# Patient Record
Sex: Female | Born: 1950 | Race: White | Hispanic: No | Smoking: Never smoker
Health system: Southern US, Community
[De-identification: ages and names within clinical notes are randomized; demographics above are authoritative.]

## PROBLEM LIST (undated history)

## (undated) DIAGNOSIS — F329 Major depressive disorder, single episode, unspecified: Secondary | ICD-10-CM

## (undated) DIAGNOSIS — F32A Depression, unspecified: Secondary | ICD-10-CM

## (undated) DIAGNOSIS — M1990 Unspecified osteoarthritis, unspecified site: Secondary | ICD-10-CM

## (undated) HISTORY — PX: ABDOMINAL HYSTERECTOMY: SHX81

---

## 2013-06-04 HISTORY — PX: BONE RESECTION, FINGER: SHX898

## 2014-08-27 DIAGNOSIS — Z1231 Encounter for screening mammogram for malignant neoplasm of breast: Secondary | ICD-10-CM | POA: Diagnosis not present

## 2015-02-16 DIAGNOSIS — F329 Major depressive disorder, single episode, unspecified: Secondary | ICD-10-CM | POA: Diagnosis not present

## 2015-02-16 DIAGNOSIS — N959 Unspecified menopausal and perimenopausal disorder: Secondary | ICD-10-CM | POA: Diagnosis not present

## 2015-03-01 DIAGNOSIS — Z1283 Encounter for screening for malignant neoplasm of skin: Secondary | ICD-10-CM | POA: Diagnosis not present

## 2015-03-01 DIAGNOSIS — L818 Other specified disorders of pigmentation: Secondary | ICD-10-CM | POA: Diagnosis not present

## 2015-10-12 DIAGNOSIS — Z1231 Encounter for screening mammogram for malignant neoplasm of breast: Secondary | ICD-10-CM | POA: Diagnosis not present

## 2015-10-25 DIAGNOSIS — J029 Acute pharyngitis, unspecified: Secondary | ICD-10-CM | POA: Diagnosis not present

## 2016-03-22 DIAGNOSIS — I83813 Varicose veins of bilateral lower extremities with pain: Secondary | ICD-10-CM | POA: Diagnosis not present

## 2016-06-13 DIAGNOSIS — L570 Actinic keratosis: Secondary | ICD-10-CM | POA: Diagnosis not present

## 2016-06-13 DIAGNOSIS — D225 Melanocytic nevi of trunk: Secondary | ICD-10-CM | POA: Diagnosis not present

## 2016-06-13 DIAGNOSIS — Z1283 Encounter for screening for malignant neoplasm of skin: Secondary | ICD-10-CM | POA: Diagnosis not present

## 2016-06-13 DIAGNOSIS — X32XXXD Exposure to sunlight, subsequent encounter: Secondary | ICD-10-CM | POA: Diagnosis not present

## 2016-06-13 DIAGNOSIS — L578 Other skin changes due to chronic exposure to nonionizing radiation: Secondary | ICD-10-CM | POA: Diagnosis not present

## 2016-06-15 DIAGNOSIS — Z23 Encounter for immunization: Secondary | ICD-10-CM | POA: Diagnosis not present

## 2016-06-15 DIAGNOSIS — Z0000 Encounter for general adult medical examination without abnormal findings: Secondary | ICD-10-CM | POA: Diagnosis not present

## 2016-06-15 DIAGNOSIS — E2839 Other primary ovarian failure: Secondary | ICD-10-CM | POA: Diagnosis not present

## 2016-06-15 DIAGNOSIS — F3342 Major depressive disorder, recurrent, in full remission: Secondary | ICD-10-CM | POA: Diagnosis not present

## 2016-06-15 DIAGNOSIS — Z1159 Encounter for screening for other viral diseases: Secondary | ICD-10-CM | POA: Diagnosis not present

## 2016-06-15 DIAGNOSIS — Z13220 Encounter for screening for lipoid disorders: Secondary | ICD-10-CM | POA: Diagnosis not present

## 2016-06-15 DIAGNOSIS — Z131 Encounter for screening for diabetes mellitus: Secondary | ICD-10-CM | POA: Diagnosis not present

## 2016-07-15 DIAGNOSIS — R350 Frequency of micturition: Secondary | ICD-10-CM | POA: Diagnosis not present

## 2016-07-15 DIAGNOSIS — R300 Dysuria: Secondary | ICD-10-CM | POA: Diagnosis not present

## 2016-09-04 DIAGNOSIS — M79644 Pain in right finger(s): Secondary | ICD-10-CM | POA: Diagnosis not present

## 2016-09-04 DIAGNOSIS — M19041 Primary osteoarthritis, right hand: Secondary | ICD-10-CM | POA: Diagnosis not present

## 2016-09-04 DIAGNOSIS — M19042 Primary osteoarthritis, left hand: Secondary | ICD-10-CM | POA: Diagnosis not present

## 2016-09-25 ENCOUNTER — Other Ambulatory Visit: Payer: Self-pay | Admitting: Family Medicine

## 2016-09-25 DIAGNOSIS — E2839 Other primary ovarian failure: Secondary | ICD-10-CM

## 2016-10-18 DIAGNOSIS — M85851 Other specified disorders of bone density and structure, right thigh: Secondary | ICD-10-CM | POA: Diagnosis not present

## 2016-10-18 DIAGNOSIS — Z803 Family history of malignant neoplasm of breast: Secondary | ICD-10-CM | POA: Diagnosis not present

## 2016-10-18 DIAGNOSIS — Z780 Asymptomatic menopausal state: Secondary | ICD-10-CM | POA: Diagnosis not present

## 2016-10-18 DIAGNOSIS — Z1231 Encounter for screening mammogram for malignant neoplasm of breast: Secondary | ICD-10-CM | POA: Diagnosis not present

## 2016-11-22 DIAGNOSIS — M85851 Other specified disorders of bone density and structure, right thigh: Secondary | ICD-10-CM | POA: Diagnosis not present

## 2016-11-22 DIAGNOSIS — K30 Functional dyspepsia: Secondary | ICD-10-CM | POA: Diagnosis not present

## 2016-11-23 ENCOUNTER — Other Ambulatory Visit: Payer: Self-pay | Admitting: Family Medicine

## 2016-11-23 DIAGNOSIS — R1013 Epigastric pain: Secondary | ICD-10-CM

## 2016-11-26 ENCOUNTER — Ambulatory Visit
Admission: RE | Admit: 2016-11-26 | Discharge: 2016-11-26 | Disposition: A | Discharge status: Home / Self Care | Payer: Medicare Other | Source: Ambulatory Visit | Attending: Family Medicine | Admitting: Family Medicine

## 2016-11-26 DIAGNOSIS — R1011 Right upper quadrant pain: Secondary | ICD-10-CM | POA: Diagnosis not present

## 2016-11-26 DIAGNOSIS — R945 Abnormal results of liver function studies: Secondary | ICD-10-CM | POA: Diagnosis not present

## 2016-11-26 DIAGNOSIS — K8020 Calculus of gallbladder without cholecystitis without obstruction: Secondary | ICD-10-CM | POA: Diagnosis not present

## 2016-11-26 DIAGNOSIS — R1013 Epigastric pain: Secondary | ICD-10-CM

## 2016-12-06 ENCOUNTER — Other Ambulatory Visit: Payer: Self-pay

## 2016-12-19 ENCOUNTER — Ambulatory Visit: Payer: Self-pay | Admitting: Surgery

## 2016-12-19 DIAGNOSIS — K8050 Calculus of bile duct without cholangitis or cholecystitis without obstruction: Secondary | ICD-10-CM | POA: Diagnosis not present

## 2016-12-19 NOTE — H&P (Signed)
Taylor Le 12/19/2016 1:57 PM Location: [facility] Patient #: [id number] DOB: Dec 12, 1950 Married / Language: English / Race: White Female  History of Present Illness (Wade Asebedo A. Kae Heller MD; 12/19/2016 2:20 PM) Patient words: This is a very pleasant and otherwise healthy 66 year old woman who is here with her husband today to discuss gallbladder surgery.  About 3 months ago she started having intermittent attacks of epigastric and right upper quadrant pain. This typically would begin about 3 hours after eating. They would last for about 3-4 hours, and were associated with nausea and vomiting. She was not able to identify triggering foods, however she did notice that she has not had any attacks since stopping consumption of cheese and dairy products. She does not have any lower GI symptoms, no melena or hematochezia. She did try over-the-counter PPIs without any relief of the pain.  She underwent a workup including labs and right upper quadrant ultrasound. Her lab work included a CBC, CMP, lipase and vitamin D on 6/21. Her liver enzymes were elevated with AST of 272, ALT of 337, and alkaline phosphatase of 349. Total bilirubin normal at 0.6. Lipase 32. The remainder of the aforementioned labs were normal. The ultrasound revealed multiple gallstones the largest measures 0.5 cm but no signs of cholecystitis. Common bile duct measured 0.5 cm. There was no focal lesion or parenchymal abnormality noted of the liver.  The patient is a 66 year old female.   Past Surgical History Malachy Moan, [place]; 12/19/2016 1:57 PM) Hysterectomy (not due to cancer) - Partial  Diagnostic Studies History Malachy Moan, RMA; 12/19/2016 1:57 PM) Colonoscopy 5-10 years ago Mammogram within last year Pap Smear 1-5 years ago  Allergies Malachy Moan, RMA; 12/19/2016 1:58 PM) Penicillins Hives.  Medication History Malachy Moan, [place]; 12/19/2016 1:59 PM) Mamie Nick  (Transdermal) Specific strength unknown - Active. BuPROPion HCl (Oral) Specific strength unknown - Active. Calcium (600MG  Tablet, 1200 Oral) Active. Medications Reconciled  Social History Malachy Moan, [place]; 12/19/2016 1:57 PM) Alcohol use Occasional alcohol use. Caffeine use Carbonated beverages, Tea. No drug use Tobacco use Never smoker.  Family History Malachy Moan, [place]; 12/19/2016 1:57 PM) Alcohol Abuse Father. Arthritis Mother. Cancer Mother. Colon Polyps Father. Heart Disease Father. Hypertension Father.  Pregnancy / Birth History Malachy Moan, [place]; 12/19/2016 1:57 PM) Age at menarche 57 years. Age of menopause 17-50 Contraceptive History Oral contraceptives. Gravida 0 Para 0 Regular periods  Other Problems Malachy Moan, RMA; 12/19/2016 1:57 PM) Arthritis Cholelithiasis General anesthesia - complications     Review of Systems Malachy Moan RMA; 12/19/2016 1:57 PM) General Not Present- Appetite Loss, Chills, Fatigue, Fever, Night Sweats, Weight Gain and Weight Loss. Skin Not Present- Change in Wart/Mole, Dryness, Hives, Jaundice, New Lesions, Non-Healing Wounds, Rash and Ulcer. HEENT Present- Wears glasses/contact lenses. Not Present- Earache, Hearing Loss, Hoarseness, Nose Bleed, Oral Ulcers, Ringing in the Ears, Seasonal Allergies, Sinus Pain, Sore Throat, Visual Disturbances and Yellow Eyes. Respiratory Not Present- Bloody sputum, Chronic Cough, Difficulty Breathing, Snoring and Wheezing. Breast Not Present- Breast Mass, Breast Pain, Nipple Discharge and Skin Changes. Cardiovascular Not Present- Chest Pain, Difficulty Breathing Lying Down, Leg Cramps, Palpitations, Rapid Heart Rate, Shortness of Breath and Swelling of Extremities. Gastrointestinal Present- Abdominal Pain, Indigestion and Nausea. Not Present- Bloating, Bloody Stool, Change in Bowel Habits, Chronic diarrhea, Constipation, Difficulty Swallowing, Excessive gas,  Gets full quickly at meals, Hemorrhoids, Rectal Pain and Vomiting. Female Genitourinary Not Present- Frequency, Nocturia, Painful Urination, Pelvic Pain and Urgency. Musculoskeletal Present- Joint Pain, Joint Stiffness and Swelling  of Extremities. Not Present- Back Pain, Muscle Pain and Muscle Weakness. Neurological Not Present- Decreased Memory, Fainting, Headaches, Numbness, Seizures, Tingling, Tremor, Trouble walking and Weakness. Psychiatric Not Present- Anxiety, Bipolar, Change in Sleep Pattern, Depression, Fearful and Frequent crying. Endocrine Not Present- Cold Intolerance, Excessive Hunger, Hair Changes, Heat Intolerance, Hot flashes and New Diabetes. Hematology Present- Easy Bruising. Not Present- Blood Thinners, Excessive bleeding, Gland problems, HIV and Persistent Infections.  Vitals Malachy Moan RMA; 12/19/2016 1:59 PM) 12/19/2016 1:59 PM Weight: 109.6 lb Height: 63in Body Surface Area: 1.5 m Body Mass Index: 19.41 kg/m  Temp.: 97.57F  Pulse: 67 (Regular)  BP: 120/66 (Sitting, Left Arm, Standard)      Physical Exam (Jovon Winterhalter A. Kae Heller MD; 12/19/2016 2:22 PM)  General Note: She is alert and well-appearing  Integumentary Note: Skin is warm and dry  Head and Neck Note: No mass or thyromegaly  Eye Note: Pupils equally round and reactive. No scleral icterus.  Chest and Lung Exam Note: Unlabored respirations. No tactile fremitus  Cardiovascular Note: Regular rate and rhythm. No pedal edema  Abdomen Note: Soft, nontender nondistended. No hernia or mass or organomegaly.  Neurologic Note: Grossly intact, normal gait  Neuropsychiatric Note: Normal mood and affect, appropriate insight  Musculoskeletal Note: Extremities symmetrical throughout, no deformity    Assessment & Plan (British Moyd A. Kae Heller MD; 12/19/2016 8:93 PM)  BILIARY COLIC (T34.28) Story: She has typical symptoms and stones on the ultrasound. I suspect that the enzyme  elevation is secondary to inflammation of the gallbladder bed. We discussed the natural history of gallstones including the possibility of ongoing symptoms, cholecystitis, pancreatitis, cholangitis as well as signs and symptoms of those which should prompt her to go to the ER. I do recommend that she proceed with laparoscopic cholecystectomy given her symptoms. We discussed the surgery. Discussed risks of bleeding, infection, pain, scarring, intra-abdominal injury specifically to the common bile duct and sequelae. Discussed the low risk of conversion to open surgery, as well as the anticipated postoperative recovery. She has been had several insightful questions all of which were answered to their satisfaction. We'll get her scheduled as soon as possible.

## 2016-12-20 NOTE — Pre-Procedure Instructions (Signed)
Taylor Le  12/20/2016      [id number] - [place], [place] [place] [address] Phone: [phone] Fax: [phone]    Your procedure is scheduled on Wednesday, July 25  Report to [facility] Admitting at 5:30 AM                 Your surgery or procedure is scheduled for 7:30 A.M.   Call this number if you have problems the morning of surgery: [phone] (pre- op desk)               For any other questions, please call [phone], Monday - Friday 8 AM - 4 PM.- ask to speck to a nurse.     Remember:  Do not eat food or drink liquids after midnight  Tuesday, July 24.  Take these medicines the morning of surgery with A SIP OF WATER : buPROPion (WELLBUTRIN XL).  Special instructions:                                                      Taylor Le- Preparing For Surgery  Before surgery, you can play an important role. Because skin is not sterile, your skin needs to be as free of germs as possible. You can reduce the number of germs on your skin by washing with CHG (chlorahexidine gluconate) Soap before surgery.  CHG is an antiseptic cleaner which kills germs and bonds with the skin to continue killing germs even after washing.  Please do not use if you have an allergy to CHG or antibacterial soaps. If your skin becomes reddened/irritated stop using the CHG.  Do not shave (including legs and underarms) for at least 48 hours prior to first CHG shower. It is OK to shave your face.  Please follow these instructions carefully.   1. Shower the NIGHT BEFORE SURGERY and the MORNING OF SURGERY with CHG.   2. If you chose to wash your hair, wash your hair first as usual with your normal shampoo.  3. After you shampoo, rinse your hair and body thoroughly to remove the shampoo.   Wash your face and private area with your normal soap and rinse.  4. Use CHG as you would any other liquid soap. You can  apply CHG directly to the skin and wash gently with a scrungie or a clean washcloth.   5. Apply the CHG Soap to your body ONLY FROM THE NECK DOWN.  Do not use on open wounds or open sores. Avoid contact with your eyes, ears, mouth and genitals (private parts). Wash genitals (private parts) with your normal soap.  6. Wash thoroughly, paying special attention to the area where your surgery will be performed.  7. Thoroughly rinse your body with warm water from the neck down.  8. DO NOT shower/wash with your normal soap after using and rinsing off the CHG Soap.  9. Pat yourself dry with a CLEAN TOWEL.   10. Wear CLEAN PAJAMAS   11. Place CLEAN SHEETS on your bed the night of your first shower and DO NOT SLEEP WITH PETS.  Day of Surgery:  Shower the Morning of Surgery Do not apply any deodorants/lotions, powders, or perfumes. Please wear clean clothes to the hospital/surgery center.    Do not  wear jewelry, make-up or nail polish.  Do not shave 48 hours prior to surgery.  Men may shave face and neck.  Do not bring valuables to the hospital.  [facility] is not responsible for any belongings or valuables.  Contacts, dentures or bridgework may not be worn into surgery.  Leave your suitcase in the car.  After surgery it may be brought to your room.  For patients admitted to the hospital, discharge time will be determined by your treatment team.  Patients discharged the day of surgery will not be allowed to drive home.   Name and phone number of your driver:   -  Please read over the following fact sheets that you were given. Coughing and Deep Breathing and Pain Booklet, Surgical Site Infections

## 2016-12-21 ENCOUNTER — Encounter (HOSPITAL_COMMUNITY): Payer: Self-pay

## 2016-12-21 ENCOUNTER — Encounter (HOSPITAL_COMMUNITY)
Admission: RE | Admit: 2016-12-21 | Discharge: 2016-12-21 | Disposition: A | Discharge status: Home / Self Care | Payer: Medicare Other | Source: Ambulatory Visit | Attending: Surgery | Admitting: Surgery

## 2016-12-21 DIAGNOSIS — Z01818 Encounter for other preprocedural examination: Secondary | ICD-10-CM | POA: Insufficient documentation

## 2016-12-21 DIAGNOSIS — Z01812 Encounter for preprocedural laboratory examination: Secondary | ICD-10-CM | POA: Diagnosis not present

## 2016-12-21 DIAGNOSIS — K8050 Calculus of bile duct without cholangitis or cholecystitis without obstruction: Secondary | ICD-10-CM | POA: Insufficient documentation

## 2016-12-21 HISTORY — DX: Depression, unspecified: F32.A

## 2016-12-21 HISTORY — DX: Unspecified osteoarthritis, unspecified site: M19.90

## 2016-12-21 HISTORY — DX: Major depressive disorder, single episode, unspecified: F32.9

## 2016-12-21 LAB — CBC
HCT: 39.4 % (ref 36.0–46.0)
HEMOGLOBIN: 13.2 g/dL (ref 12.0–15.0)
MCH: 29.9 pg (ref 26.0–34.0)
MCHC: 33.5 g/dL (ref 30.0–36.0)
MCV: 89.1 fL (ref 78.0–100.0)
PLATELETS: 221 10*3/uL (ref 150–400)
RBC: 4.42 MIL/uL (ref 3.87–5.11)
RDW: 13.2 % (ref 11.5–15.5)
WBC: 4.6 10*3/uL (ref 4.0–10.5)

## 2016-12-21 LAB — BASIC METABOLIC PANEL
Anion gap: 8 (ref 5–15)
BUN: 14 mg/dL (ref 6–20)
CALCIUM: 10 mg/dL (ref 8.9–10.3)
CO2: 31 mmol/L (ref 22–32)
CREATININE: 0.86 mg/dL (ref 0.44–1.00)
Chloride: 101 mmol/L (ref 101–111)
GFR calc Af Amer: >60 mL/min (ref >60)
GLUCOSE: 92 mg/dL (ref 65–99)
Potassium: 4.2 mmol/L (ref 3.5–5.1)
Sodium: 140 mmol/L (ref 135–145)

## 2016-12-21 MED ORDER — CHLORHEXIDINE GLUCONATE 4 % EX LIQD: 60.0000 mL | Freq: Once | CUTANEOUS | Status: DC

## 2016-12-21 NOTE — Progress Notes (Addendum)
UYW:XIPPND, Lennette Bihari, MD  Cardiologist: pt denies   EKG: pt denies past year if ever  Stress test: pt denies ever   ECHO: pt denies ever  Cardiac Cath: pt denies ever  Chest x-ray: pt denies ever

## 2016-12-26 ENCOUNTER — Ambulatory Visit (HOSPITAL_COMMUNITY)
Admission: RE | Admit: 2016-12-26 | Discharge: 2016-12-26 | Disposition: A | Discharge status: Home / Self Care | Payer: Medicare Other | Source: Ambulatory Visit | Attending: Surgery | Admitting: Surgery

## 2016-12-26 ENCOUNTER — Ambulatory Visit (HOSPITAL_COMMUNITY): Payer: Medicare Other | Admitting: Anesthesiology

## 2016-12-26 ENCOUNTER — Encounter (HOSPITAL_COMMUNITY): Payer: Self-pay | Admitting: *Deleted

## 2016-12-26 ENCOUNTER — Encounter (HOSPITAL_COMMUNITY)
Admission: RE | Disposition: A | Discharge status: Home / Self Care | Payer: Self-pay | Source: Ambulatory Visit | Attending: Surgery

## 2016-12-26 DIAGNOSIS — Z811 Family history of alcohol abuse and dependence: Secondary | ICD-10-CM | POA: Insufficient documentation

## 2016-12-26 DIAGNOSIS — M1990 Unspecified osteoarthritis, unspecified site: Secondary | ICD-10-CM | POA: Insufficient documentation

## 2016-12-26 DIAGNOSIS — K828 Other specified diseases of gallbladder: Secondary | ICD-10-CM | POA: Insufficient documentation

## 2016-12-26 DIAGNOSIS — Z8261 Family history of arthritis: Secondary | ICD-10-CM | POA: Insufficient documentation

## 2016-12-26 DIAGNOSIS — F329 Major depressive disorder, single episode, unspecified: Secondary | ICD-10-CM | POA: Insufficient documentation

## 2016-12-26 DIAGNOSIS — Z8371 Family history of colonic polyps: Secondary | ICD-10-CM | POA: Diagnosis not present

## 2016-12-26 DIAGNOSIS — Z880 Allergy status to penicillin: Secondary | ICD-10-CM | POA: Diagnosis not present

## 2016-12-26 DIAGNOSIS — Z90710 Acquired absence of both cervix and uterus: Secondary | ICD-10-CM | POA: Insufficient documentation

## 2016-12-26 DIAGNOSIS — Z809 Family history of malignant neoplasm, unspecified: Secondary | ICD-10-CM | POA: Insufficient documentation

## 2016-12-26 DIAGNOSIS — K8010 Calculus of gallbladder with chronic cholecystitis without obstruction: Secondary | ICD-10-CM | POA: Diagnosis not present

## 2016-12-26 DIAGNOSIS — Z8249 Family history of ischemic heart disease and other diseases of the circulatory system: Secondary | ICD-10-CM | POA: Insufficient documentation

## 2016-12-26 DIAGNOSIS — K7689 Other specified diseases of liver: Secondary | ICD-10-CM | POA: Diagnosis not present

## 2016-12-26 DIAGNOSIS — K740 Hepatic fibrosis: Secondary | ICD-10-CM | POA: Diagnosis not present

## 2016-12-26 DIAGNOSIS — K8050 Calculus of bile duct without cholangitis or cholecystitis without obstruction: Secondary | ICD-10-CM | POA: Diagnosis not present

## 2016-12-26 DIAGNOSIS — K8064 Calculus of gallbladder and bile duct with chronic cholecystitis without obstruction: Secondary | ICD-10-CM | POA: Diagnosis present

## 2016-12-26 HISTORY — PX: CHOLECYSTECTOMY: SHX55

## 2016-12-26 HISTORY — PX: LIVER BIOPSY: SHX301

## 2016-12-26 SURGERY — LAPAROSCOPIC CHOLECYSTECTOMY
Anesthesia: General

## 2016-12-26 MED ORDER — LACTATED RINGERS IV SOLN
INTRAVENOUS | Status: DC | PRN
  Administered 2016-12-26 (×2): via INTRAVENOUS

## 2016-12-26 MED ORDER — 0.9 % SODIUM CHLORIDE (POUR BTL) OPTIME
TOPICAL | Status: DC | PRN
  Administered 2016-12-26: 1000 mL

## 2016-12-26 MED ORDER — SODIUM CHLORIDE 0.9% FLUSH: 3.0000 mL | INTRAVENOUS | Status: DC | PRN

## 2016-12-26 MED ORDER — ONDANSETRON HCL 4 MG/2ML IJ SOLN: 4.0000 mg | Freq: Four times a day (QID) | INTRAMUSCULAR | Status: DC | PRN

## 2016-12-26 MED ORDER — CEFAZOLIN SODIUM-DEXTROSE 2-4 GM/100ML-% IV SOLN
2.0000 g | INTRAVENOUS | Status: AC
  Administered 2016-12-26: 2 g via INTRAVENOUS

## 2016-12-26 MED ORDER — LIDOCAINE HCL (CARDIAC) 20 MG/ML IV SOLN
INTRAVENOUS | Status: DC | PRN
  Administered 2016-12-26: 50 mg via INTRAVENOUS

## 2016-12-26 MED ORDER — PHENYLEPHRINE 40 MCG/ML (10ML) SYRINGE FOR IV PUSH (FOR BLOOD PRESSURE SUPPORT)
PREFILLED_SYRINGE | INTRAVENOUS | Status: DC | PRN
  Administered 2016-12-26: 80 ug via INTRAVENOUS

## 2016-12-26 MED ORDER — SUGAMMADEX SODIUM 200 MG/2ML IV SOLN
INTRAVENOUS | Status: AC
  Filled 2016-12-26: qty 2

## 2016-12-26 MED ORDER — OXYCODONE HCL 5 MG PO TABS
ORAL_TABLET | ORAL | Status: AC
  Administered 2016-12-26: 10 mg via ORAL
  Filled 2016-12-26: qty 2

## 2016-12-26 MED ORDER — GABAPENTIN 300 MG PO CAPS
300.0000 mg | ORAL_CAPSULE | ORAL | Status: AC
  Administered 2016-12-26: 300 mg via ORAL

## 2016-12-26 MED ORDER — SODIUM CHLORIDE 0.9 % IR SOLN
Status: DC | PRN
  Administered 2016-12-26: 1

## 2016-12-26 MED ORDER — ACETAMINOPHEN 500 MG PO TABS
ORAL_TABLET | ORAL | Status: AC
  Administered 2016-12-26: 1000 mg via ORAL
  Filled 2016-12-26: qty 2

## 2016-12-26 MED ORDER — FENTANYL CITRATE (PF) 100 MCG/2ML IJ SOLN
INTRAMUSCULAR | Status: DC | PRN
  Administered 2016-12-26: 50 ug via INTRAVENOUS
  Administered 2016-12-26: 100 ug via INTRAVENOUS

## 2016-12-26 MED ORDER — HYDROCODONE-ACETAMINOPHEN 5-325 MG PO TABS: 1.0000 | ORAL_TABLET | Freq: Four times a day (QID) | ORAL | 0 refills | Status: AC | PRN

## 2016-12-26 MED ORDER — GABAPENTIN 300 MG PO CAPS
ORAL_CAPSULE | ORAL | Status: AC
  Administered 2016-12-26: 300 mg via ORAL
  Filled 2016-12-26: qty 1

## 2016-12-26 MED ORDER — SCOPOLAMINE 1 MG/3DAYS TD PT72
MEDICATED_PATCH | TRANSDERMAL | Status: AC
  Filled 2016-12-26: qty 1

## 2016-12-26 MED ORDER — CELECOXIB 200 MG PO CAPS
400.0000 mg | ORAL_CAPSULE | ORAL | Status: AC
  Administered 2016-12-26: 200 mg via ORAL

## 2016-12-26 MED ORDER — DEXAMETHASONE SODIUM PHOSPHATE 10 MG/ML IJ SOLN
INTRAMUSCULAR | Status: AC
  Filled 2016-12-26: qty 1

## 2016-12-26 MED ORDER — FENTANYL CITRATE (PF) 250 MCG/5ML IJ SOLN
INTRAMUSCULAR | Status: AC
  Filled 2016-12-26: qty 5

## 2016-12-26 MED ORDER — LIDOCAINE 2% (20 MG/ML) 5 ML SYRINGE
INTRAMUSCULAR | Status: AC
  Filled 2016-12-26: qty 5

## 2016-12-26 MED ORDER — EPHEDRINE SULFATE-NACL 50-0.9 MG/10ML-% IV SOSY
PREFILLED_SYRINGE | INTRAVENOUS | Status: DC | PRN
  Administered 2016-12-26 (×3): 10 mg via INTRAVENOUS

## 2016-12-26 MED ORDER — HYDROMORPHONE HCL 1 MG/ML IJ SOLN
INTRAMUSCULAR | Status: AC
  Administered 2016-12-26: 0.5 mg via INTRAVENOUS
  Filled 2016-12-26: qty 0.5

## 2016-12-26 MED ORDER — ONDANSETRON HCL 4 MG/2ML IJ SOLN
INTRAMUSCULAR | Status: DC | PRN
  Administered 2016-12-26: 4 mg via INTRAVENOUS

## 2016-12-26 MED ORDER — HYDROMORPHONE HCL 1 MG/ML IJ SOLN
INTRAMUSCULAR | Status: AC
  Filled 2016-12-26: qty 0.5

## 2016-12-26 MED ORDER — ROCURONIUM BROMIDE 10 MG/ML (PF) SYRINGE
PREFILLED_SYRINGE | INTRAVENOUS | Status: AC
  Filled 2016-12-26: qty 5

## 2016-12-26 MED ORDER — CEFAZOLIN SODIUM-DEXTROSE 2-4 GM/100ML-% IV SOLN
INTRAVENOUS | Status: AC
  Filled 2016-12-26: qty 100

## 2016-12-26 MED ORDER — ACETAMINOPHEN 325 MG PO TABS
650.0000 mg | ORAL_TABLET | ORAL | Status: DC | PRN
  Administered 2016-12-26: 650 mg via ORAL

## 2016-12-26 MED ORDER — BUPIVACAINE-EPINEPHRINE (PF) 0.25% -1:200000 IJ SOLN
INTRAMUSCULAR | Status: AC
  Filled 2016-12-26: qty 30

## 2016-12-26 MED ORDER — PROPOFOL 10 MG/ML IV BOLUS
INTRAVENOUS | Status: AC
  Filled 2016-12-26: qty 20

## 2016-12-26 MED ORDER — SCOPOLAMINE 1 MG/3DAYS TD PT72
1.0000 | MEDICATED_PATCH | TRANSDERMAL | Status: DC
  Administered 2016-12-26: 1.5 mg via TRANSDERMAL
  Filled 2016-12-26: qty 1

## 2016-12-26 MED ORDER — ACETAMINOPHEN 500 MG PO TABS
1000.0000 mg | ORAL_TABLET | ORAL | Status: AC
  Administered 2016-12-26: 1000 mg via ORAL

## 2016-12-26 MED ORDER — MIDAZOLAM HCL 5 MG/5ML IJ SOLN
INTRAMUSCULAR | Status: DC | PRN
  Administered 2016-12-26: 2 mg via INTRAVENOUS

## 2016-12-26 MED ORDER — DOCUSATE SODIUM 100 MG PO CAPS: 100.0000 mg | ORAL_CAPSULE | Freq: Two times a day (BID) | ORAL | 0 refills | Status: AC

## 2016-12-26 MED ORDER — ACETAMINOPHEN 325 MG PO TABS
ORAL_TABLET | ORAL | Status: AC
  Administered 2016-12-26: 650 mg via ORAL
  Filled 2016-12-26: qty 2

## 2016-12-26 MED ORDER — HYDROMORPHONE HCL 1 MG/ML IJ SOLN
0.2500 mg | INTRAMUSCULAR | Status: DC | PRN
  Administered 2016-12-26 (×2): 0.5 mg via INTRAVENOUS

## 2016-12-26 MED ORDER — OXYCODONE HCL 5 MG PO TABS
5.0000 mg | ORAL_TABLET | ORAL | Status: DC | PRN
  Administered 2016-12-26: 10 mg via ORAL

## 2016-12-26 MED ORDER — ROCURONIUM BROMIDE 100 MG/10ML IV SOLN
INTRAVENOUS | Status: DC | PRN
  Administered 2016-12-26: 50 mg via INTRAVENOUS

## 2016-12-26 MED ORDER — PHENYLEPHRINE 40 MCG/ML (10ML) SYRINGE FOR IV PUSH (FOR BLOOD PRESSURE SUPPORT)
PREFILLED_SYRINGE | INTRAVENOUS | Status: AC
  Filled 2016-12-26: qty 10

## 2016-12-26 MED ORDER — MIDAZOLAM HCL 2 MG/2ML IJ SOLN
INTRAMUSCULAR | Status: AC
  Filled 2016-12-26: qty 2

## 2016-12-26 MED ORDER — CELECOXIB 200 MG PO CAPS
ORAL_CAPSULE | ORAL | Status: AC
  Administered 2016-12-26: 200 mg via ORAL
  Filled 2016-12-26: qty 2

## 2016-12-26 MED ORDER — PROPOFOL 10 MG/ML IV BOLUS
INTRAVENOUS | Status: DC | PRN
  Administered 2016-12-26: 130 mg via INTRAVENOUS

## 2016-12-26 MED ORDER — SODIUM CHLORIDE 0.9% FLUSH: 3.0000 mL | Freq: Two times a day (BID) | INTRAVENOUS | Status: DC

## 2016-12-26 MED ORDER — SODIUM CHLORIDE 0.9 % IV SOLN: 250.0000 mL | INTRAVENOUS | Status: DC | PRN

## 2016-12-26 MED ORDER — SUGAMMADEX SODIUM 200 MG/2ML IV SOLN
INTRAVENOUS | Status: DC | PRN
  Administered 2016-12-26: 200 mg via INTRAVENOUS

## 2016-12-26 MED ORDER — DEXAMETHASONE SODIUM PHOSPHATE 10 MG/ML IJ SOLN
INTRAMUSCULAR | Status: DC | PRN
  Administered 2016-12-26: 10 mg via INTRAVENOUS

## 2016-12-26 MED ORDER — EPHEDRINE 5 MG/ML INJ
INTRAVENOUS | Status: AC
  Filled 2016-12-26: qty 10

## 2016-12-26 MED ORDER — BUPIVACAINE-EPINEPHRINE 0.25% -1:200000 IJ SOLN
INTRAMUSCULAR | Status: DC | PRN
  Administered 2016-12-26: 21 mL

## 2016-12-26 MED ORDER — ONDANSETRON HCL 4 MG/2ML IJ SOLN
INTRAMUSCULAR | Status: AC
  Filled 2016-12-26: qty 2

## 2016-12-26 MED ORDER — FENTANYL CITRATE (PF) 100 MCG/2ML IJ SOLN: 25.0000 ug | INTRAMUSCULAR | Status: DC | PRN

## 2016-12-26 MED ORDER — OXYCODONE HCL 5 MG/5ML PO SOLN: 5.0000 mg | Freq: Once | ORAL | Status: DC | PRN

## 2016-12-26 MED ORDER — ACETAMINOPHEN 650 MG RE SUPP: 650.0000 mg | RECTAL | Status: DC | PRN

## 2016-12-26 MED ORDER — OXYCODONE HCL 5 MG PO TABS: 5.0000 mg | ORAL_TABLET | Freq: Once | ORAL | Status: DC | PRN

## 2016-12-26 SURGICAL SUPPLY — 33 items
APPLIER CLIP 5 13 M/L LIGAMAX5 (MISCELLANEOUS) ×3
BLADE CLIPPER SURG (BLADE) IMPLANT
CANISTER SUCT 3000ML PPV (MISCELLANEOUS) ×3 IMPLANT
CHLORAPREP W/TINT 26ML (MISCELLANEOUS) ×3 IMPLANT
CLIP APPLIE 5 13 M/L LIGAMAX5 (MISCELLANEOUS) ×1 IMPLANT
COVER SURGICAL LIGHT HANDLE (MISCELLANEOUS) ×3 IMPLANT
DERMABOND ADVANCED (GAUZE/BANDAGES/DRESSINGS) ×2
DERMABOND ADVANCED .7 DNX12 (GAUZE/BANDAGES/DRESSINGS) ×1 IMPLANT
ELECT REM PT RETURN 9FT ADLT (ELECTROSURGICAL) ×3
ELECTRODE REM PT RTRN 9FT ADLT (ELECTROSURGICAL) ×1 IMPLANT
GLOVE BIO SURGEON STRL SZ 6 (GLOVE) ×3 IMPLANT
GLOVE BIOGEL PI IND STRL 6.5 (GLOVE) ×1 IMPLANT
GLOVE BIOGEL PI INDICATOR 6.5 (GLOVE) ×2
GOWN STRL REUS W/ TWL LRG LVL3 (GOWN DISPOSABLE) ×3 IMPLANT
GOWN STRL REUS W/TWL LRG LVL3 (GOWN DISPOSABLE) ×6
GRASPER SUT TROCAR 14GX15 (MISCELLANEOUS) ×6 IMPLANT
KIT BASIN OR (CUSTOM PROCEDURE TRAY) ×3 IMPLANT
KIT ROOM TURNOVER OR (KITS) ×3 IMPLANT
NEEDLE INSUFFLATION 14GA 120MM (NEEDLE) ×3 IMPLANT
NS IRRIG 1000ML POUR BTL (IV SOLUTION) ×3 IMPLANT
PAD ARMBOARD 7.5X6 YLW CONV (MISCELLANEOUS) ×3 IMPLANT
POUCH SPECIMEN RETRIEVAL 10MM (ENDOMECHANICALS) ×3 IMPLANT
SCISSORS LAP 5X35 DISP (ENDOMECHANICALS) ×3 IMPLANT
SET IRRIG TUBING LAPAROSCOPIC (IRRIGATION / IRRIGATOR) ×3 IMPLANT
SLEEVE ENDOPATH XCEL 5M (ENDOMECHANICALS) ×6 IMPLANT
SPECIMEN JAR SMALL (MISCELLANEOUS) ×3 IMPLANT
SUT MNCRL AB 4-0 PS2 18 (SUTURE) ×3 IMPLANT
TOWEL OR 17X24 6PK STRL BLUE (TOWEL DISPOSABLE) ×3 IMPLANT
TOWEL OR 17X26 10 PK STRL BLUE (TOWEL DISPOSABLE) IMPLANT
TRAY LAPAROSCOPIC MC (CUSTOM PROCEDURE TRAY) ×3 IMPLANT
TROCAR XCEL NON-BLD 11X100MML (ENDOMECHANICALS) ×3 IMPLANT
TROCAR XCEL NON-BLD 5MMX100MML (ENDOMECHANICALS) ×3 IMPLANT
TUBING INSUFFLATION (TUBING) ×3 IMPLANT

## 2016-12-26 NOTE — Anesthesia Postprocedure Evaluation (Signed)
Anesthesia Post Note  Patient: Taylor Le  Procedure(s) Performed: Procedure(s) (LRB): LAPAROSCOPIC CHOLECYSTECTOMY (N/A)     Patient location during evaluation: PACU Anesthesia Type: General Level of consciousness: awake and alert and patient cooperative Pain management: pain level controlled Vital Signs Assessment: post-procedure vital signs reviewed and stable Respiratory status: spontaneous breathing and respiratory function stable Cardiovascular status: stable Anesthetic complications: no    Last Vitals:  Vitals:   12/26/16 0915 12/26/16 0930  BP: 118/61 (!) 117/59  Pulse: 70 71  Resp: 15 (!) 22  Temp:      Last Pain:  Vitals:   12/26/16 0906  TempSrc:   PainSc: 0-No pain                 Loman Logan S

## 2016-12-26 NOTE — Anesthesia Preprocedure Evaluation (Signed)
Anesthesia Evaluation  Patient identified by MRN, date of birth, ID band Patient awake    Reviewed: Allergy & Precautions, H&P , NPO status , Patient's Chart, lab work & pertinent test results  Airway Mallampati: II   Neck ROM: full    Dental   Pulmonary neg pulmonary ROS,    breath sounds clear to auscultation       Cardiovascular negative cardio ROS   Rhythm:regular Rate:Normal     Neuro/Psych PSYCHIATRIC DISORDERS Depression    GI/Hepatic   Endo/Other    Renal/GU      Musculoskeletal  (+) Arthritis ,   Abdominal   Peds  Hematology   Anesthesia Other Findings   Reproductive/Obstetrics                             Anesthesia Physical Anesthesia Plan  ASA: II  Anesthesia Plan: General   Post-op Pain Management:    Induction: Intravenous  PONV Risk Score and Plan: 4 or greater and Ondansetron, Dexamethasone, Propofol, Midazolam, Scopolamine patch - Pre-op and Treatment may vary due to age or medical condition  Airway Management Planned: Oral ETT  Additional Equipment:   Intra-op Plan:   Post-operative Plan: Extubation in OR  Informed Consent: I have reviewed the patients History and Physical, chart, labs and discussed the procedure including the risks, benefits and alternatives for the proposed anesthesia with the patient or authorized representative who has indicated his/her understanding and acceptance.     Plan Discussed with: CRNA, Anesthesiologist and Surgeon  Anesthesia Plan Comments:         Anesthesia Quick Evaluation

## 2016-12-26 NOTE — Anesthesia Procedure Notes (Signed)
Procedure Name: Intubation Date/Time: 12/26/2016 7:36 AM Performed by: Kyung Rudd Pre-anesthesia Checklist: Patient identified, Emergency Drugs available, Suction available and Patient being monitored Patient Re-evaluated:Patient Re-evaluated prior to induction Oxygen Delivery Method: Circle system utilized Preoxygenation: Pre-oxygenation with 100% oxygen Induction Type: IV induction Ventilation: Mask ventilation without difficulty Laryngoscope Size: Mac and 3 Grade View: Grade II Tube type: Oral Tube size: 7.0 mm Number of attempts: 1 Airway Equipment and Method: Stylet Placement Confirmation: ETT inserted through vocal cords under direct vision,  positive ETCO2 and breath sounds checked- equal and bilateral Secured at: 21 cm Tube secured with: Tape Dental Injury: Teeth and Oropharynx as per pre-operative assessment

## 2016-12-26 NOTE — Interval H&P Note (Signed)
History and Physical Interval Note:  12/26/2016 6:35 AM  Taylor Le  has presented today for surgery, with the diagnosis of biliary colic  The various methods of treatment have been discussed with the patient and family. After consideration of risks, benefits and other options for treatment, the patient has consented to  Procedure(s): LAPAROSCOPIC CHOLECYSTECTOMY (N/A) as a surgical intervention .  The patient's history has been reviewed, patient examined, no change in status, stable for surgery.  I have reviewed the patient's chart and labs.  Questions were answered to the patient's satisfaction.     Mariangela Heldt Rich Brave

## 2016-12-26 NOTE — Transfer of Care (Signed)
Immediate Anesthesia Transfer of Care Note  Patient: Taylor Le  Procedure(s) Performed: Procedure(s): LAPAROSCOPIC CHOLECYSTECTOMY (N/A)  Patient Location: PACU  Anesthesia Type:General  Level of Consciousness: awake and drowsy  Airway & Oxygen Therapy: Patient Spontanous Breathing and Patient connected to nasal cannula oxygen  Post-op Assessment: Report given to RN, Post -op Vital signs reviewed and stable and Patient moving all extremities X 4  Post vital signs: Reviewed and stable  Last Vitals:  Vitals:   12/26/16 0646  BP: 135/71  Resp: 18  Temp: 36.7 C    Last Pain:  Vitals:   12/26/16 0646  TempSrc: Oral         Complications: No apparent anesthesia complications

## 2016-12-26 NOTE — H&P (View-Only) (Signed)
Taylor Le 12/19/2016 1:57 PM Location: [facility] Patient #: [id number] DOB: 03/19/1951 Married / Language: English / Race: White Female  History of Present Illness (Vallarie Fei A. Kae Heller MD; 12/19/2016 2:20 PM) Patient words: This is a very pleasant and otherwise healthy 66 year old woman who is here with her husband today to discuss gallbladder surgery.  About 3 months ago she started having intermittent attacks of epigastric and right upper quadrant pain. This typically would begin about 3 hours after eating. They would last for about 3-4 hours, and were associated with nausea and vomiting. She was not able to identify triggering foods, however she did notice that she has not had any attacks since stopping consumption of cheese and dairy products. She does not have any lower GI symptoms, no melena or hematochezia. She did try over-the-counter PPIs without any relief of the pain.  She underwent a workup including labs and right upper quadrant ultrasound. Her lab work included a CBC, CMP, lipase and vitamin D on 6/21. Her liver enzymes were elevated with AST of 272, ALT of 337, and alkaline phosphatase of 349. Total bilirubin normal at 0.6. Lipase 32. The remainder of the aforementioned labs were normal. The ultrasound revealed multiple gallstones the largest measures 0.5 cm but no signs of cholecystitis. Common bile duct measured 0.5 cm. There was no focal lesion or parenchymal abnormality noted of the liver.  The patient is a 66 year old female.   Past Surgical History Malachy Moan, [place]; 12/19/2016 1:57 PM) Hysterectomy (not due to cancer) - Partial  Diagnostic Studies History Malachy Moan, RMA; 12/19/2016 1:57 PM) Colonoscopy 5-10 years ago Mammogram within last year Pap Smear 1-5 years ago  Allergies Malachy Moan, RMA; 12/19/2016 1:58 PM) Penicillins Hives.  Medication History Malachy Moan, [place]; 12/19/2016 1:59 PM) Mamie Nick  (Transdermal) Specific strength unknown - Active. BuPROPion HCl (Oral) Specific strength unknown - Active. Calcium (600MG  Tablet, 1200 Oral) Active. Medications Reconciled  Social History Malachy Moan, [place]; 12/19/2016 1:57 PM) Alcohol use Occasional alcohol use. Caffeine use Carbonated beverages, Tea. No drug use Tobacco use Never smoker.  Family History Malachy Moan, [place]; 12/19/2016 1:57 PM) Alcohol Abuse Father. Arthritis Mother. Cancer Mother. Colon Polyps Father. Heart Disease Father. Hypertension Father.  Pregnancy / Birth History Malachy Moan, [place]; 12/19/2016 1:57 PM) Age at menarche 53 years. Age of menopause 57-50 Contraceptive History Oral contraceptives. Gravida 0 Para 0 Regular periods  Other Problems Malachy Moan, RMA; 12/19/2016 1:57 PM) Arthritis Cholelithiasis General anesthesia - complications     Review of Systems Malachy Moan RMA; 12/19/2016 1:57 PM) General Not Present- Appetite Loss, Chills, Fatigue, Fever, Night Sweats, Weight Gain and Weight Loss. Skin Not Present- Change in Wart/Mole, Dryness, Hives, Jaundice, New Lesions, Non-Healing Wounds, Rash and Ulcer. HEENT Present- Wears glasses/contact lenses. Not Present- Earache, Hearing Loss, Hoarseness, Nose Bleed, Oral Ulcers, Ringing in the Ears, Seasonal Allergies, Sinus Pain, Sore Throat, Visual Disturbances and Yellow Eyes. Respiratory Not Present- Bloody sputum, Chronic Cough, Difficulty Breathing, Snoring and Wheezing. Breast Not Present- Breast Mass, Breast Pain, Nipple Discharge and Skin Changes. Cardiovascular Not Present- Chest Pain, Difficulty Breathing Lying Down, Leg Cramps, Palpitations, Rapid Heart Rate, Shortness of Breath and Swelling of Extremities. Gastrointestinal Present- Abdominal Pain, Indigestion and Nausea. Not Present- Bloating, Bloody Stool, Change in Bowel Habits, Chronic diarrhea, Constipation, Difficulty Swallowing, Excessive gas,  Gets full quickly at meals, Hemorrhoids, Rectal Pain and Vomiting. Female Genitourinary Not Present- Frequency, Nocturia, Painful Urination, Pelvic Pain and Urgency. Musculoskeletal Present- Joint Pain, Joint Stiffness and Swelling  of Extremities. Not Present- Back Pain, Muscle Pain and Muscle Weakness. Neurological Not Present- Decreased Memory, Fainting, Headaches, Numbness, Seizures, Tingling, Tremor, Trouble walking and Weakness. Psychiatric Not Present- Anxiety, Bipolar, Change in Sleep Pattern, Depression, Fearful and Frequent crying. Endocrine Not Present- Cold Intolerance, Excessive Hunger, Hair Changes, Heat Intolerance, Hot flashes and New Diabetes. Hematology Present- Easy Bruising. Not Present- Blood Thinners, Excessive bleeding, Gland problems, HIV and Persistent Infections.  Vitals Malachy Moan RMA; 12/19/2016 1:59 PM) 12/19/2016 1:59 PM Weight: 109.6 lb Height: 63in Body Surface Area: 1.5 m Body Mass Index: 19.41 kg/m  Temp.: 97.80F  Pulse: 67 (Regular)  BP: 120/66 (Sitting, Left Arm, Standard)      Physical Exam (Mardel Grudzien A. Kae Heller MD; 12/19/2016 2:22 PM)  General Note: She is alert and well-appearing  Integumentary Note: Skin is warm and dry  Head and Neck Note: No mass or thyromegaly  Eye Note: Pupils equally round and reactive. No scleral icterus.  Chest and Lung Exam Note: Unlabored respirations. No tactile fremitus  Cardiovascular Note: Regular rate and rhythm. No pedal edema  Abdomen Note: Soft, nontender nondistended. No hernia or mass or organomegaly.  Neurologic Note: Grossly intact, normal gait  Neuropsychiatric Note: Normal mood and affect, appropriate insight  Musculoskeletal Note: Extremities symmetrical throughout, no deformity    Assessment & Plan (Suezette Lafave A. Kae Heller MD; 12/19/2016 5:88 PM)  BILIARY COLIC (F02.77) Story: She has typical symptoms and stones on the ultrasound. I suspect that the enzyme  elevation is secondary to inflammation of the gallbladder bed. We discussed the natural history of gallstones including the possibility of ongoing symptoms, cholecystitis, pancreatitis, cholangitis as well as signs and symptoms of those which should prompt her to go to the ER. I do recommend that she proceed with laparoscopic cholecystectomy given her symptoms. We discussed the surgery. Discussed risks of bleeding, infection, pain, scarring, intra-abdominal injury specifically to the common bile duct and sequelae. Discussed the low risk of conversion to open surgery, as well as the anticipated postoperative recovery. She has been had several insightful questions all of which were answered to their satisfaction. We'll get her scheduled as soon as possible.

## 2016-12-26 NOTE — Discharge Instructions (Signed)
[facility] ______[facility] °LAPAROSCOPIC SURGERY: POST OP INSTRUCTIONS °Always review your discharge instruction sheet given to you by the facility where your surgery was performed. °IF YOU HAVE DISABILITY OR FAMILY LEAVE FORMS, YOU MUST BRING THEM TO THE OFFICE FOR PROCESSING.   °DO NOT GIVE THEM TO YOUR DOCTOR. ° °1. A prescription for pain medication may be given to you upon discharge.  Take your pain medication as prescribed, if needed.  If narcotic pain medicine is not needed, then you may take acetaminophen (Tylenol) or ibuprofen (Advil) as needed. °2. Take your usually prescribed medications unless otherwise directed. °3. If you need a refill on your pain medication, please contact your pharmacy.  They will contact our office to request authorization. Prescriptions will not be filled after 5pm or on week-ends. °4. You should follow a light diet the first few days after arrival home, such as soup and crackers, etc.  Be sure to include lots of fluids daily. °5. Most patients will experience some swelling and bruising in the area of the incisions.  Ice packs will help.  Swelling and bruising can take several days to resolve.  °6. It is common to experience some constipation if taking pain medication after surgery.  Increasing fluid intake and taking a stool softener (such as Colace) will usually help or prevent this problem from occurring.  A mild laxative (Milk of Magnesia or Miralax) should be taken according to package instructions if there are no bowel movements after 48 hours. °7. Unless discharge instructions indicate otherwise, you may remove your bandages 24-48 hours after surgery, and you may shower at that time.  You may have steri-strips (small skin tapes) in place directly over the incision.  These strips should be left on the skin for 7-10 days.  If your surgeon used skin glue on the incision, you may shower in 24 hours.  The glue will flake off over the next 2-3 weeks.  Any sutures or  staples will be removed at the office during your follow-up visit. °8. ACTIVITIES:  You may resume regular (light) daily activities beginning the next day--such as daily self-care, walking, climbing stairs--gradually increasing activities as tolerated.  You may have sexual intercourse when it is comfortable.  Refrain from any heavy lifting or straining until approved by your doctor. °a. You may drive when you are no longer taking prescription pain medication, you can comfortably wear a seatbelt, and you can safely maneuver your car and apply brakes. °b. RETURN TO WORK:  __1 week________________________________________________________ °9. You should see your doctor in the office for a follow-up appointment approximately 2-3 weeks after your surgery.  Make sure that you call for this appointment within a day or two after you arrive home to insure a convenient appointment time. °10. OTHER INSTRUCTIONS: __________________________________________________________________________________________________________________________ __________________________________________________________________________________________________________________________ °WHEN TO CALL YOUR DOCTOR: °1. Fever over 101.0 °2. Inability to urinate °3. Continued bleeding from incision. °4. Increased pain, redness, or drainage from the incision. °5. Increasing abdominal pain ° °The clinic staff is available to answer your questions during regular business hours.  Please don’t hesitate to call and ask to speak to one of the nurses for clinical concerns.  If you have a medical emergency, go to the nearest emergency room or call 911.  A surgeon from [facility] is always on call at the hospital. °[address], [address], [place], [place]  [address] ? [address] [address], [place], [place]   [address] °[phone] ? [phone] ? FAX [phone] °Web   site: [email or web address] ° °

## 2016-12-26 NOTE — Op Note (Signed)
Operative Note  Taylor Le 66 y.o. female [id number]  12/26/2016  Surgeon: Clovis Riley   Assistant: OR staff  Procedure performed: Laparoscopic Cholecystectomy, liver biopsy  Preop diagnosis: biliary colic Post-op diagnosis/intraop findings: same, two patches of scarring/fibrosis on the liver edge  Specimens: gallbladder, liver biopsy  EBL: minimal  Complications: none  Description of procedure: After obtaining informed consent the patient was brought to the operating room. Prophylactic antibiotics and subcutaneous heparin were administered. SCD's were applied. General endotracheal anesthesia was initiated and a formal time-out was performed. The abdomen was prepped and draped in the usual sterile fashion and the abdomen was entered using an infraumbilical veress needle after instilling the site with local. Insufflation to 25mmHg was obtained, 48mm trocar and camera introduced and gross inspection revealed no evidence of injury from our entry. There were two patches about 3cm in size on the anterior liver edge, one on the left and one on the right that appeared scarred/fibrotic. No other lesions or palpable mass was appreciated, and no other intraabdominal abnormalities on gross inspection. Two 32mm trocars were introduced in the right midclavicular and right anterior axillary lines under direct visualization and following infiltration with local. An 77mm trocar was placed in the epigastrium. The gallbladder was retracted cephalad and the infundibulum was retracted laterally. Omental adhesions to the gallbladder were taken down with blunt dissection and cautery. A combination of hook electrocautery and blunt dissection was utilized to clear the peritoneum from the neck and cystic duct, circumferentially isolating the cystic artery and cystic duct and lifting the gallbladder from the cystic plate. The critical view of safety was achieved with the cystic artery, cystic duct, and liver bed  visualized between them with no other structures. The artery was clipped with a single clip proximally and distally and divided as was the cystic duct with three clips on the proximal end. The gallbladder was dissected from the liver plate using electrocautery.Once freed the gallbladder was placed in an endocatch bag and removed intact through the epigastric trocar site. A trucut biopsy needle was introduced via a stab incision in the right upper quadrant and a core biopsy was attempted but insufficient tissue was obtained. I excised a 39mm wedge of abnormal/scarred tissue from the anterior edge of the liver sharply, the edge where the gallbladder bed had ended. Hemostasis was obtained with cautery here. Hemostasis was once again confirmed, and reinspection of the abdomen revealed no injuries. The clips were well opposed without any bile leak from the duct or the liver bed. The 101mm trocar site in the epigastrium was closed with a 0 vicryl in the fascia under direct visualization using a PMI device. The abdomen was desufflated and all trocars removed. The skin incisions were closed with running subcuticular monocryl and dermabond. The patient was awakened, extubated and transported to the recovery room in stable condition.   All counts were correct at the completion of the case.

## 2016-12-27 ENCOUNTER — Encounter (HOSPITAL_COMMUNITY): Payer: Self-pay | Admitting: Surgery

## 2017-01-18 DIAGNOSIS — Z9049 Acquired absence of other specified parts of digestive tract: Secondary | ICD-10-CM | POA: Diagnosis not present

## 2017-04-12 DIAGNOSIS — K640 First degree hemorrhoids: Secondary | ICD-10-CM | POA: Diagnosis not present

## 2017-04-12 DIAGNOSIS — Z86010 Personal history of colonic polyps: Secondary | ICD-10-CM | POA: Diagnosis not present

## 2017-07-17 DIAGNOSIS — Z01419 Encounter for gynecological examination (general) (routine) without abnormal findings: Secondary | ICD-10-CM | POA: Diagnosis not present

## 2017-07-17 DIAGNOSIS — Z86010 Personal history of colonic polyps: Secondary | ICD-10-CM | POA: Diagnosis not present

## 2017-07-17 DIAGNOSIS — K30 Functional dyspepsia: Secondary | ICD-10-CM | POA: Diagnosis not present

## 2017-07-17 DIAGNOSIS — Z0000 Encounter for general adult medical examination without abnormal findings: Secondary | ICD-10-CM | POA: Diagnosis not present

## 2017-07-17 DIAGNOSIS — F3342 Major depressive disorder, recurrent, in full remission: Secondary | ICD-10-CM | POA: Diagnosis not present

## 2017-07-17 DIAGNOSIS — E2839 Other primary ovarian failure: Secondary | ICD-10-CM | POA: Diagnosis not present

## 2017-07-17 DIAGNOSIS — M85851 Other specified disorders of bone density and structure, right thigh: Secondary | ICD-10-CM | POA: Diagnosis not present

## 2017-07-17 DIAGNOSIS — Z23 Encounter for immunization: Secondary | ICD-10-CM | POA: Diagnosis not present

## 2017-07-17 DIAGNOSIS — Z131 Encounter for screening for diabetes mellitus: Secondary | ICD-10-CM | POA: Diagnosis not present

## 2017-11-11 DIAGNOSIS — Z1231 Encounter for screening mammogram for malignant neoplasm of breast: Secondary | ICD-10-CM | POA: Diagnosis not present

## 2017-11-29 DIAGNOSIS — H5212 Myopia, left eye: Secondary | ICD-10-CM | POA: Diagnosis not present

## 2017-11-29 DIAGNOSIS — H2513 Age-related nuclear cataract, bilateral: Secondary | ICD-10-CM | POA: Diagnosis not present

## 2017-11-29 DIAGNOSIS — H5211 Myopia, right eye: Secondary | ICD-10-CM | POA: Diagnosis not present

## 2017-11-29 DIAGNOSIS — H16223 Keratoconjunctivitis sicca, not specified as Sjogren's, bilateral: Secondary | ICD-10-CM | POA: Diagnosis not present

## 2018-03-03 DIAGNOSIS — M545 Low back pain: Secondary | ICD-10-CM | POA: Diagnosis not present

## 2018-03-03 DIAGNOSIS — H538 Other visual disturbances: Secondary | ICD-10-CM | POA: Diagnosis not present

## 2018-03-04 DIAGNOSIS — H5319 Other subjective visual disturbances: Secondary | ICD-10-CM | POA: Diagnosis not present

## 2018-03-04 DIAGNOSIS — H2513 Age-related nuclear cataract, bilateral: Secondary | ICD-10-CM | POA: Diagnosis not present

## 2018-03-04 DIAGNOSIS — H524 Presbyopia: Secondary | ICD-10-CM | POA: Diagnosis not present

## 2018-03-04 DIAGNOSIS — H5201 Hypermetropia, right eye: Secondary | ICD-10-CM | POA: Diagnosis not present

## 2018-03-18 DIAGNOSIS — H0011 Chalazion right upper eyelid: Secondary | ICD-10-CM | POA: Diagnosis not present

## 2018-03-18 DIAGNOSIS — H16223 Keratoconjunctivitis sicca, not specified as Sjogren's, bilateral: Secondary | ICD-10-CM | POA: Diagnosis not present

## 2018-03-18 DIAGNOSIS — H2513 Age-related nuclear cataract, bilateral: Secondary | ICD-10-CM | POA: Diagnosis not present

## 2018-03-18 DIAGNOSIS — H5201 Hypermetropia, right eye: Secondary | ICD-10-CM | POA: Diagnosis not present

## 2018-03-18 DIAGNOSIS — H52211 Irregular astigmatism, right eye: Secondary | ICD-10-CM | POA: Diagnosis not present

## 2018-03-18 DIAGNOSIS — H5319 Other subjective visual disturbances: Secondary | ICD-10-CM | POA: Diagnosis not present

## 2018-03-24 DIAGNOSIS — M545 Low back pain: Secondary | ICD-10-CM | POA: Diagnosis not present

## 2018-03-27 DIAGNOSIS — M545 Low back pain: Secondary | ICD-10-CM | POA: Diagnosis not present

## 2018-04-02 DIAGNOSIS — H52211 Irregular astigmatism, right eye: Secondary | ICD-10-CM | POA: Diagnosis not present

## 2018-04-02 DIAGNOSIS — H5319 Other subjective visual disturbances: Secondary | ICD-10-CM | POA: Diagnosis not present

## 2018-04-02 DIAGNOSIS — H2513 Age-related nuclear cataract, bilateral: Secondary | ICD-10-CM | POA: Diagnosis not present

## 2018-04-02 DIAGNOSIS — H16223 Keratoconjunctivitis sicca, not specified as Sjogren's, bilateral: Secondary | ICD-10-CM | POA: Diagnosis not present

## 2018-05-27 IMAGING — US US ABDOMEN LIMITED
1 series · 14 of 25 positions shown · non-contrast
Comparison: None.

CLINICAL DATA: Dyspepsia. Right upper quadrant and epigastric pain.

EXAM:
ULTRASOUND ABDOMEN LIMITED RIGHT UPPER QUADRANT

[Series 1: us abdomen limited · 0.14mm/px · 14 of 44 slices shown]
[im 1/44]
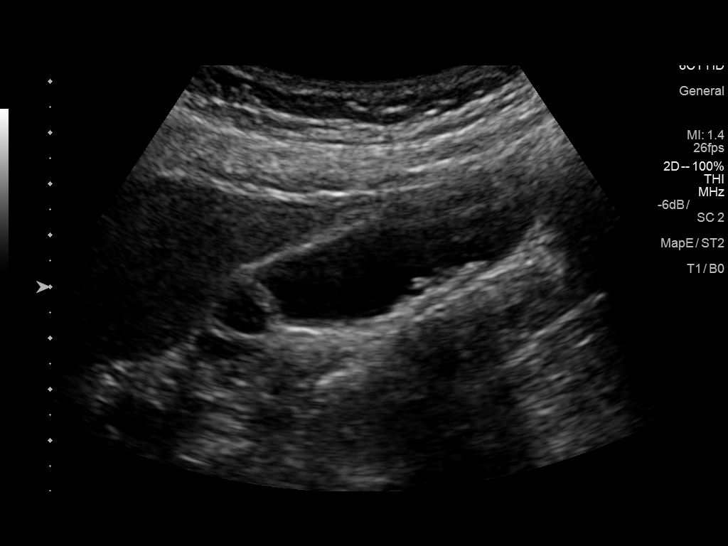
[im 4/44]
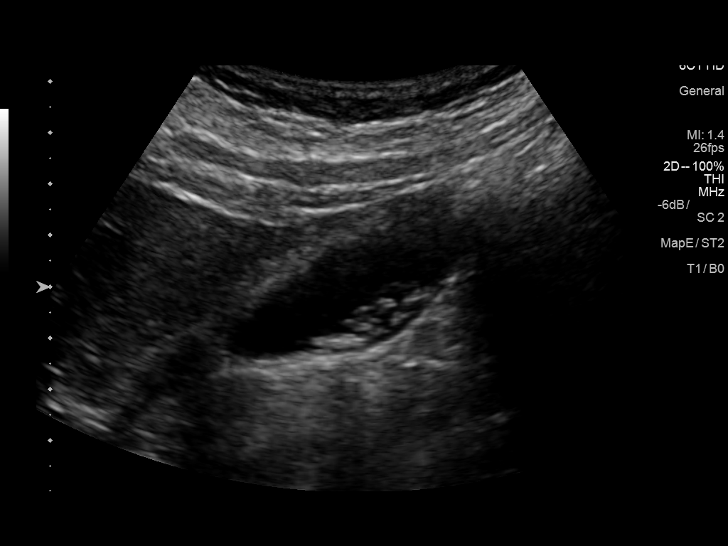
[im 8/44]
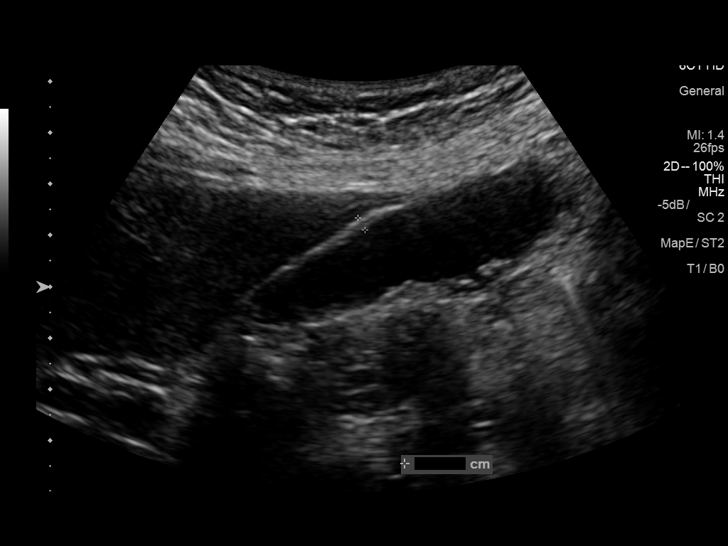
[im 11/44]
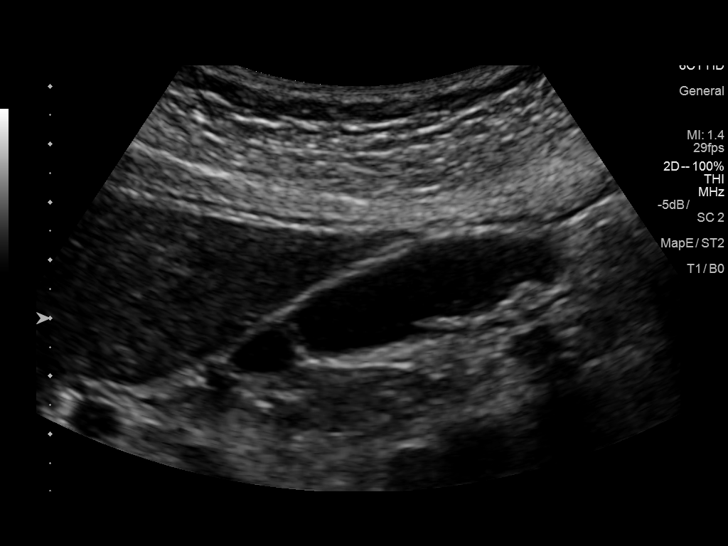
[im 15/44]
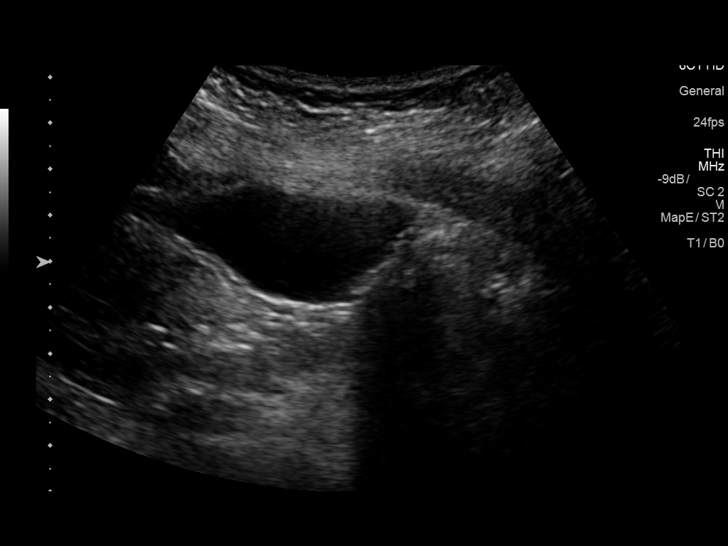
[im 17/44]
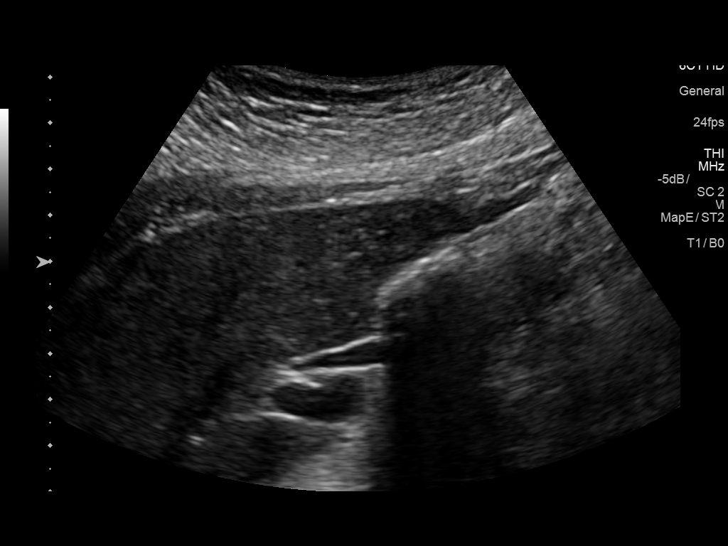
[im 20/44]
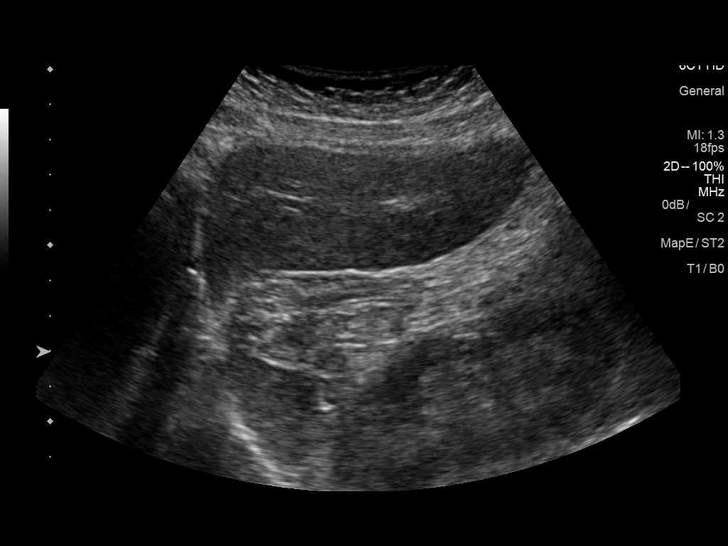
[im 24/44]
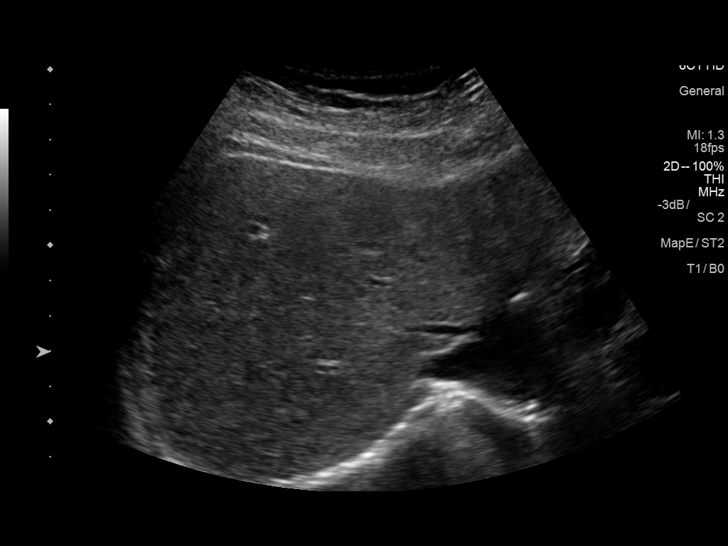
[im 27/44]
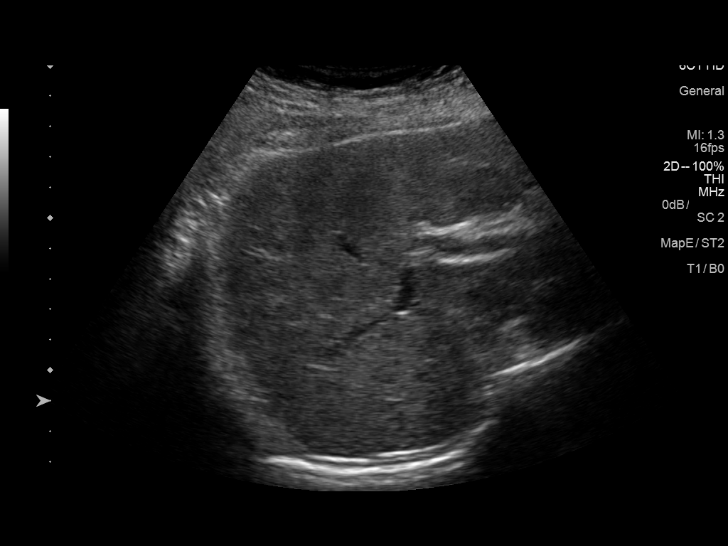
[im 29/44]
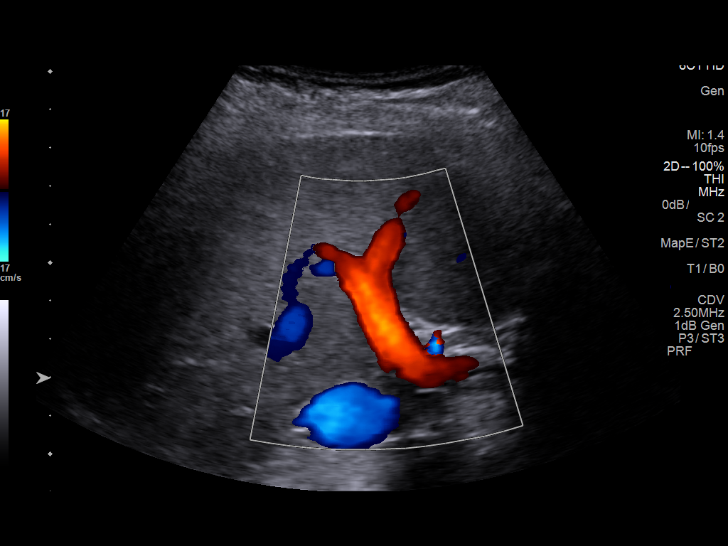
[im 33/44]
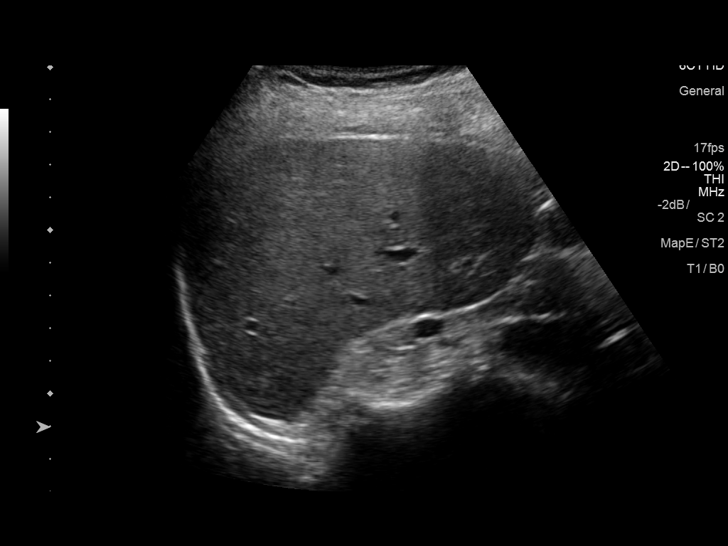
[im 36/44]
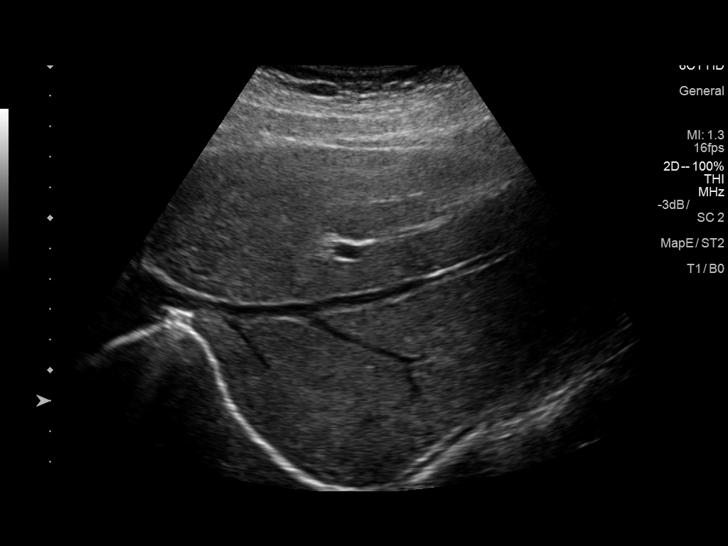
[im 40/44]
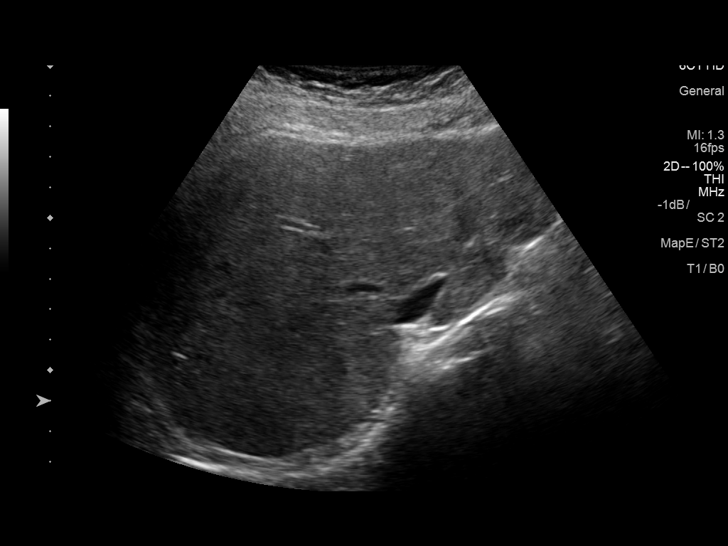
[im 44/44]
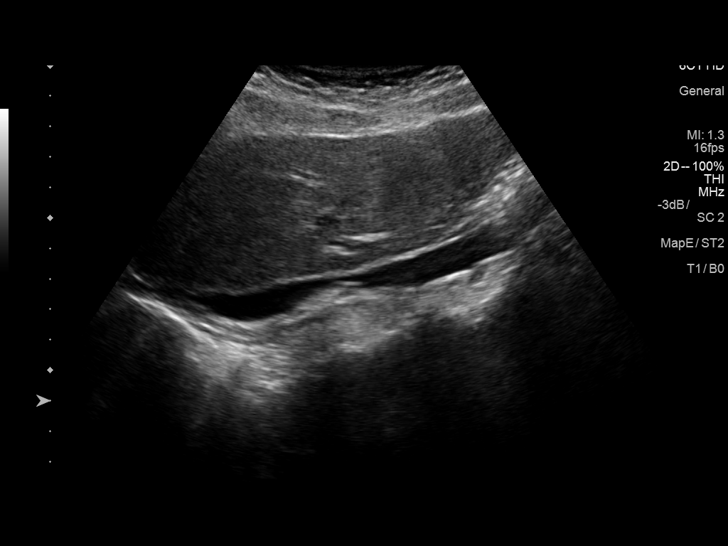

[14 of 25 positions shown; findings below may reference images not displayed]

FINDINGS: Gallbladder:

Multiple echogenic foci in the gallbladder compatible with small
stones, largest measures 0.5 cm. No significant gallbladder wall
thickening. Negative for pericholecystic fluid.

Common bile duct:

Diameter: 0.5 cm.

Liver:

No focal lesion identified. Within normal limits in parenchymal
echogenicity.
IMPRESSION: Cholelithiasis.  No biliary dilatation.

## 2018-07-22 DIAGNOSIS — E2839 Other primary ovarian failure: Secondary | ICD-10-CM | POA: Diagnosis not present

## 2018-07-22 DIAGNOSIS — M85851 Other specified disorders of bone density and structure, right thigh: Secondary | ICD-10-CM | POA: Diagnosis not present

## 2018-07-22 DIAGNOSIS — Z86010 Personal history of colonic polyps: Secondary | ICD-10-CM | POA: Diagnosis not present

## 2018-07-22 DIAGNOSIS — Z0000 Encounter for general adult medical examination without abnormal findings: Secondary | ICD-10-CM | POA: Diagnosis not present

## 2018-07-22 DIAGNOSIS — R1903 Right lower quadrant abdominal swelling, mass and lump: Secondary | ICD-10-CM | POA: Diagnosis not present

## 2018-07-22 DIAGNOSIS — F3342 Major depressive disorder, recurrent, in full remission: Secondary | ICD-10-CM | POA: Diagnosis not present

## 2018-07-22 DIAGNOSIS — Z131 Encounter for screening for diabetes mellitus: Secondary | ICD-10-CM | POA: Diagnosis not present

## 2018-07-28 ENCOUNTER — Other Ambulatory Visit: Payer: Self-pay | Admitting: Family Medicine

## 2018-07-28 DIAGNOSIS — R1903 Right lower quadrant abdominal swelling, mass and lump: Secondary | ICD-10-CM

## 2018-08-14 ENCOUNTER — Other Ambulatory Visit: Payer: Medicare Other

## 2018-08-15 ENCOUNTER — Ambulatory Visit
Admission: RE | Admit: 2018-08-15 | Discharge: 2018-08-15 | Disposition: A | Discharge status: Home / Self Care | Payer: Medicare Other | Source: Ambulatory Visit | Attending: Family Medicine | Admitting: Family Medicine

## 2018-08-15 ENCOUNTER — Other Ambulatory Visit: Payer: Self-pay

## 2018-08-15 DIAGNOSIS — R1903 Right lower quadrant abdominal swelling, mass and lump: Secondary | ICD-10-CM

## 2018-08-15 DIAGNOSIS — R1909 Other intra-abdominal and pelvic swelling, mass and lump: Secondary | ICD-10-CM | POA: Diagnosis not present

## 2018-12-19 DIAGNOSIS — Z90710 Acquired absence of both cervix and uterus: Secondary | ICD-10-CM | POA: Diagnosis not present

## 2018-12-19 DIAGNOSIS — Z8262 Family history of osteoporosis: Secondary | ICD-10-CM | POA: Diagnosis not present

## 2018-12-19 DIAGNOSIS — Z803 Family history of malignant neoplasm of breast: Secondary | ICD-10-CM | POA: Diagnosis not present

## 2018-12-19 DIAGNOSIS — Z1231 Encounter for screening mammogram for malignant neoplasm of breast: Secondary | ICD-10-CM | POA: Diagnosis not present

## 2018-12-19 DIAGNOSIS — R29890 Loss of height: Secondary | ICD-10-CM | POA: Diagnosis not present

## 2019-05-04 DIAGNOSIS — H2513 Age-related nuclear cataract, bilateral: Secondary | ICD-10-CM | POA: Diagnosis not present

## 2019-05-04 DIAGNOSIS — H524 Presbyopia: Secondary | ICD-10-CM | POA: Diagnosis not present

## 2019-05-04 DIAGNOSIS — H0011 Chalazion right upper eyelid: Secondary | ICD-10-CM | POA: Diagnosis not present

## 2019-05-04 DIAGNOSIS — H5319 Other subjective visual disturbances: Secondary | ICD-10-CM | POA: Diagnosis not present

## 2019-05-04 DIAGNOSIS — H5201 Hypermetropia, right eye: Secondary | ICD-10-CM | POA: Diagnosis not present

## 2019-05-04 DIAGNOSIS — H16223 Keratoconjunctivitis sicca, not specified as Sjogren's, bilateral: Secondary | ICD-10-CM | POA: Diagnosis not present

## 2019-05-04 DIAGNOSIS — H52211 Irregular astigmatism, right eye: Secondary | ICD-10-CM | POA: Diagnosis not present

## 2019-08-04 DIAGNOSIS — Z0000 Encounter for general adult medical examination without abnormal findings: Secondary | ICD-10-CM | POA: Diagnosis not present

## 2019-08-04 DIAGNOSIS — F3342 Major depressive disorder, recurrent, in full remission: Secondary | ICD-10-CM | POA: Diagnosis not present

## 2019-08-04 DIAGNOSIS — E2839 Other primary ovarian failure: Secondary | ICD-10-CM | POA: Diagnosis not present

## 2019-08-04 DIAGNOSIS — Z131 Encounter for screening for diabetes mellitus: Secondary | ICD-10-CM | POA: Diagnosis not present

## 2019-08-04 DIAGNOSIS — M85851 Other specified disorders of bone density and structure, right thigh: Secondary | ICD-10-CM | POA: Diagnosis not present

## 2019-09-01 DIAGNOSIS — M2559 Pain in other specified joint: Secondary | ICD-10-CM | POA: Diagnosis not present

## 2019-09-01 DIAGNOSIS — R5383 Other fatigue: Secondary | ICD-10-CM | POA: Diagnosis not present

## 2019-09-01 DIAGNOSIS — L308 Other specified dermatitis: Secondary | ICD-10-CM | POA: Diagnosis not present

## 2019-12-25 DIAGNOSIS — Z1231 Encounter for screening mammogram for malignant neoplasm of breast: Secondary | ICD-10-CM | POA: Diagnosis not present

## 2020-01-08 DIAGNOSIS — L821 Other seborrheic keratosis: Secondary | ICD-10-CM | POA: Diagnosis not present

## 2020-01-08 DIAGNOSIS — L570 Actinic keratosis: Secondary | ICD-10-CM | POA: Diagnosis not present

## 2020-01-08 DIAGNOSIS — D225 Melanocytic nevi of trunk: Secondary | ICD-10-CM | POA: Diagnosis not present

## 2020-01-08 DIAGNOSIS — X32XXXD Exposure to sunlight, subsequent encounter: Secondary | ICD-10-CM | POA: Diagnosis not present

## 2020-01-11 DIAGNOSIS — L282 Other prurigo: Secondary | ICD-10-CM | POA: Diagnosis not present

## 2020-01-11 DIAGNOSIS — R21 Rash and other nonspecific skin eruption: Secondary | ICD-10-CM | POA: Diagnosis not present

## 2020-02-13 IMAGING — US US PELVIS COMPLETE WITH TRANSVAGINAL
1 series · 14 of 25 positions shown · non-contrast
Comparison: None

CLINICAL DATA: Pelvic mass felt on recent clinical examination.
History of hysterectomy and unilateral oophorectomy. The patient
does not know which ovary was taken.

EXAM:
TRANSABDOMINAL AND TRANSVAGINAL ULTRASOUND OF PELVIS
TECHNIQUE: Both transabdominal and transvaginal ultrasound examinations of the
pelvis were performed. Transabdominal technique was performed for
global imaging of the pelvis including uterus, ovaries, adnexal
regions, and pelvic cul-de-sac. It was necessary to proceed with
endovaginal exam following the transabdominal exam to visualize the
remaining ovary.

[Series 1: us pelvis complete with transvaginal · 0.17mm/px · 14 of 30 slices shown]
[im 1/30]
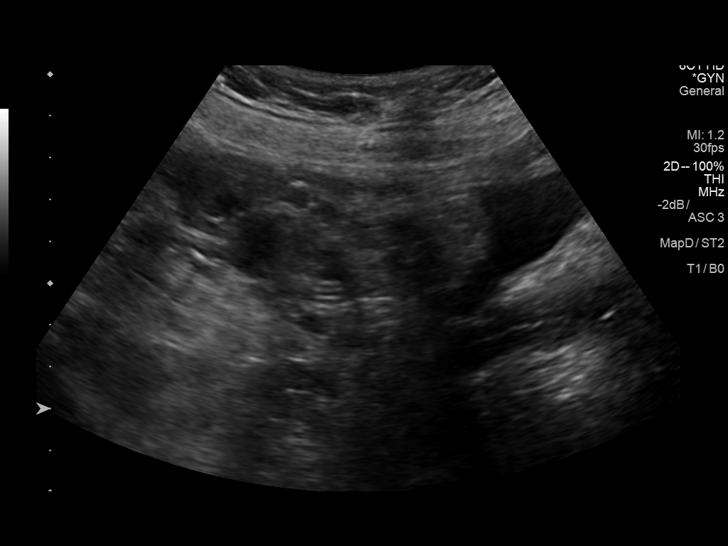
[im 3/30]
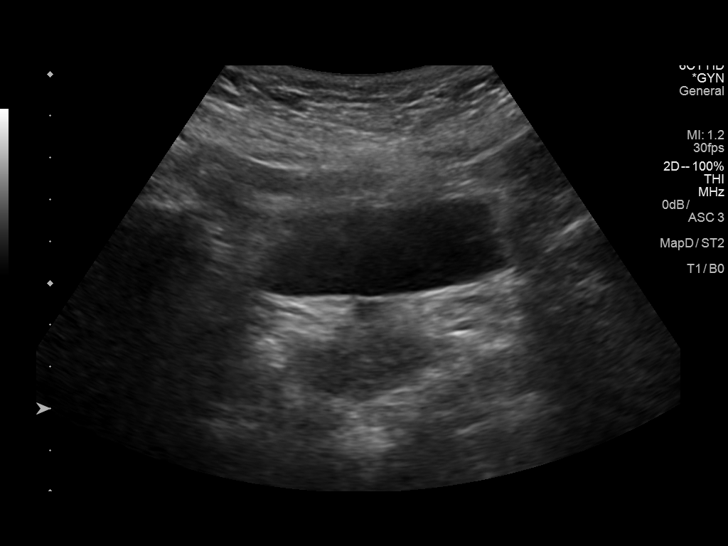
[im 5/30]
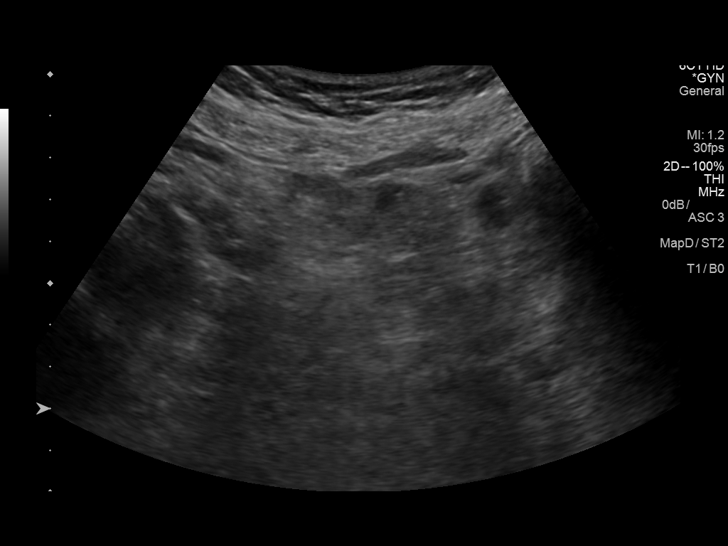
[im 8/30]
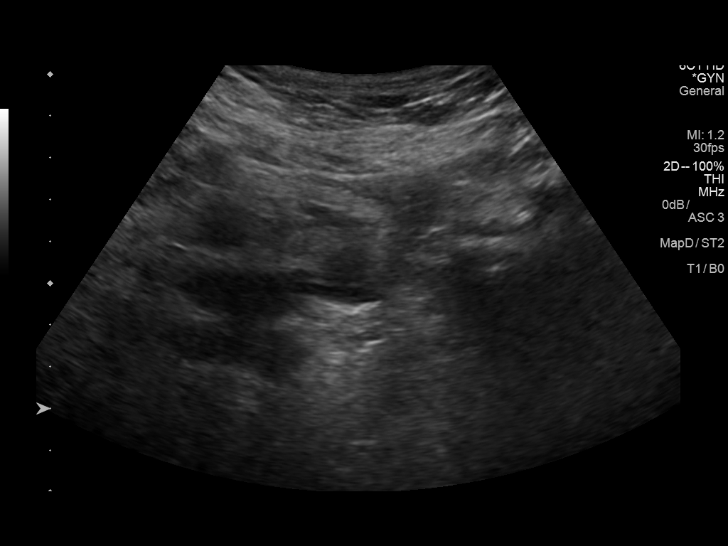
[im 10/30]
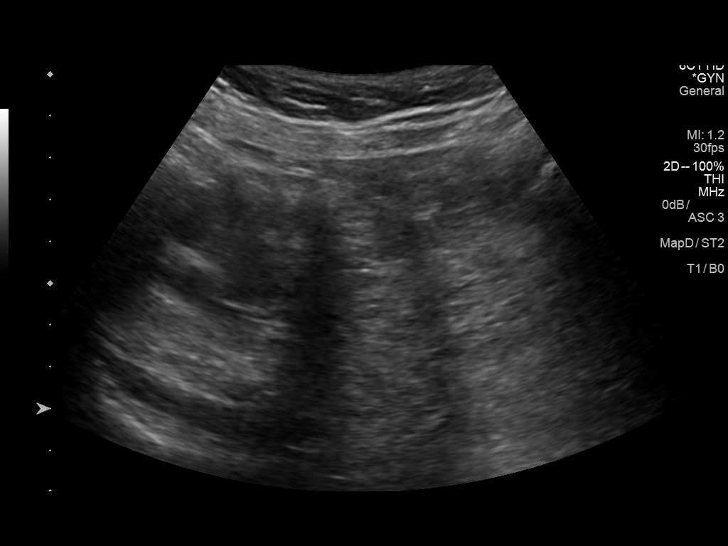
[im 11/30]
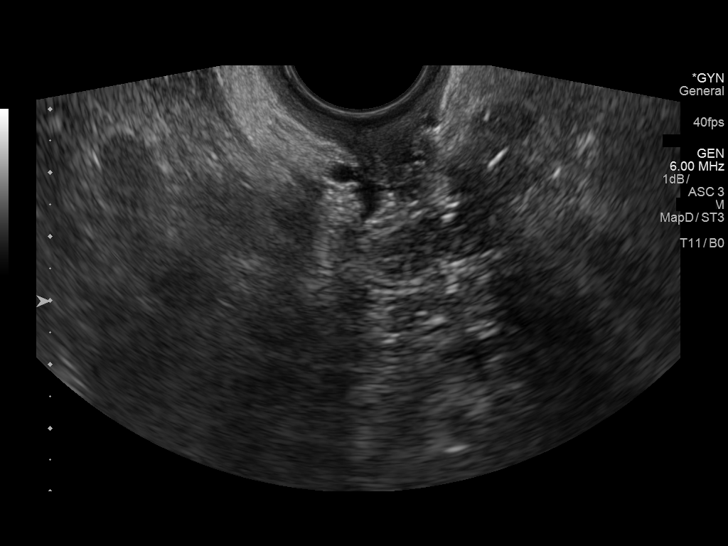
[im 14/30]
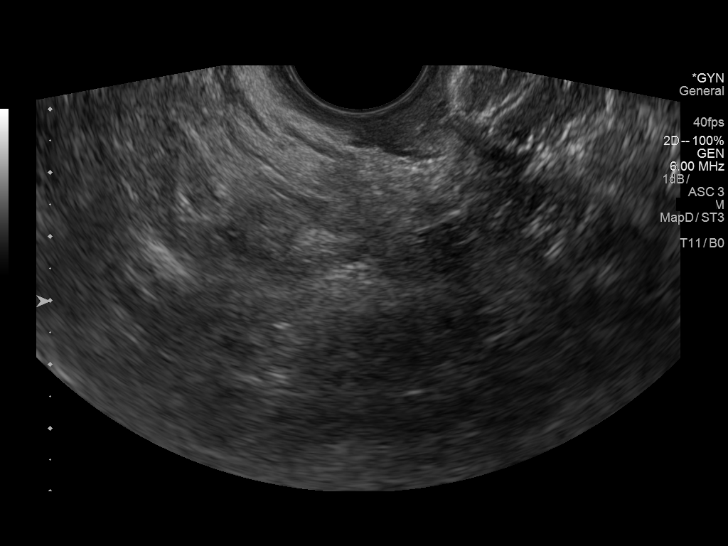
[im 16/30]
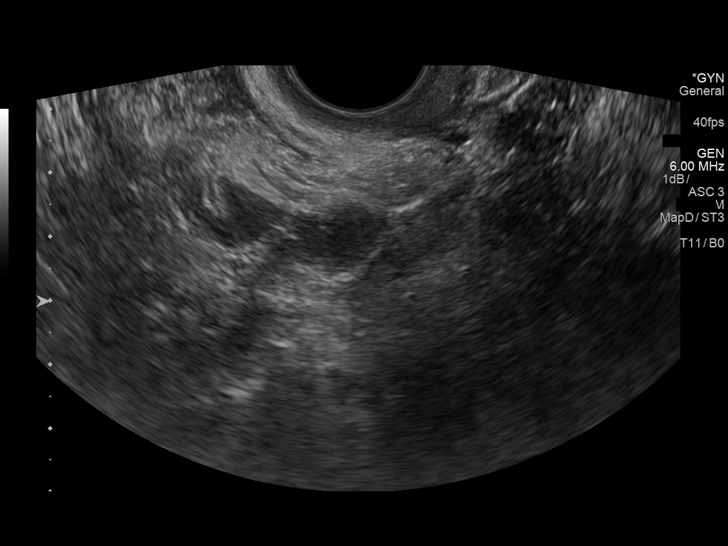
[im 19/30]
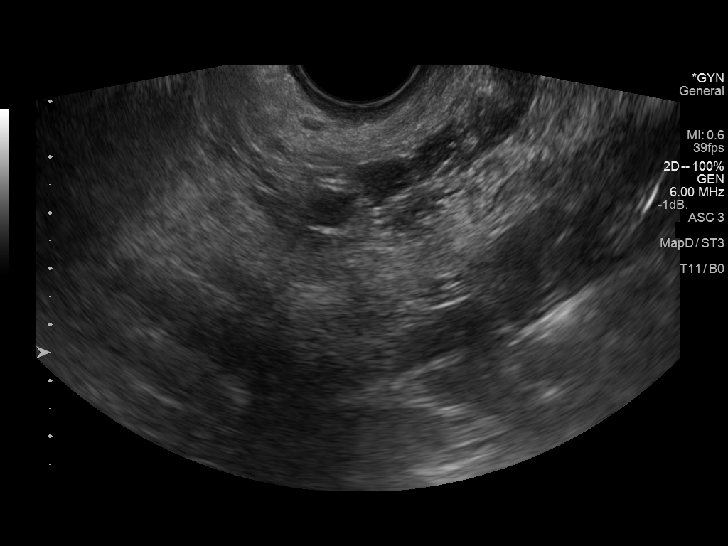
[im 20/30]
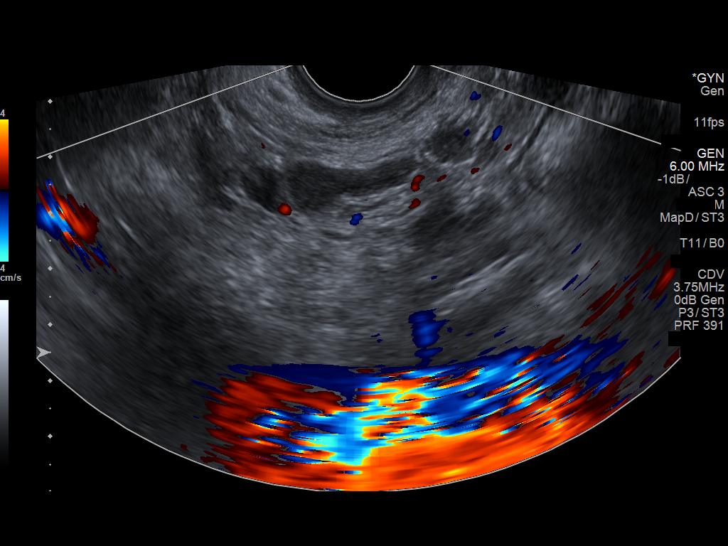
[im 22/30]
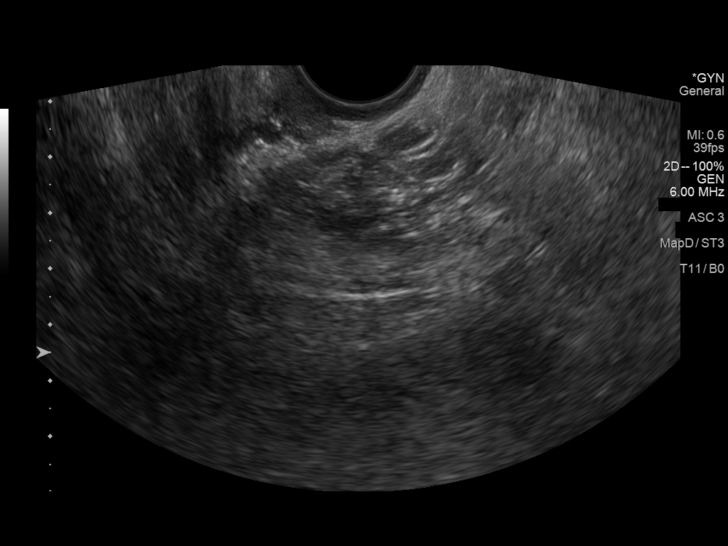
[im 25/30]
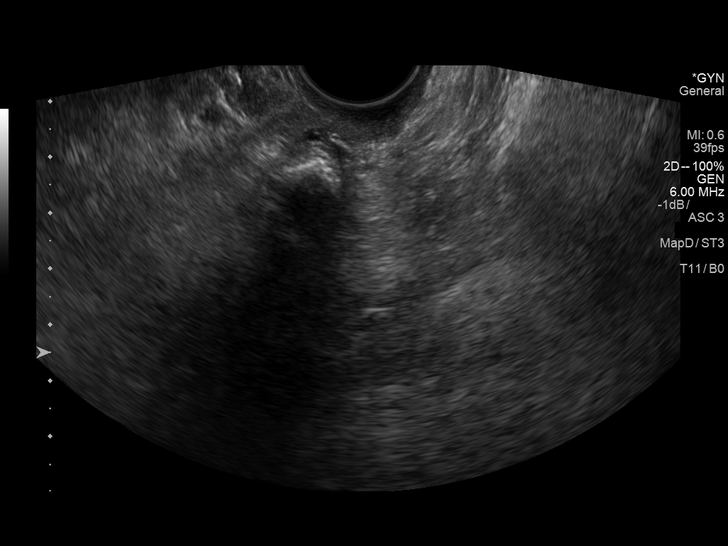
[im 27/30]
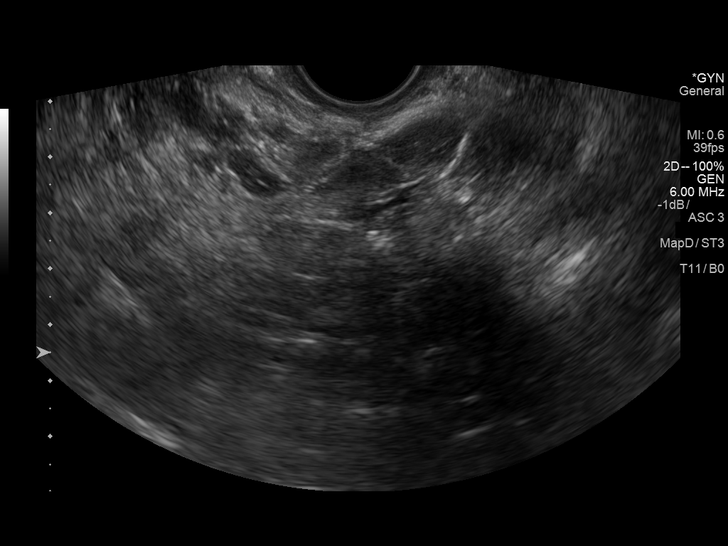
[im 30/30]
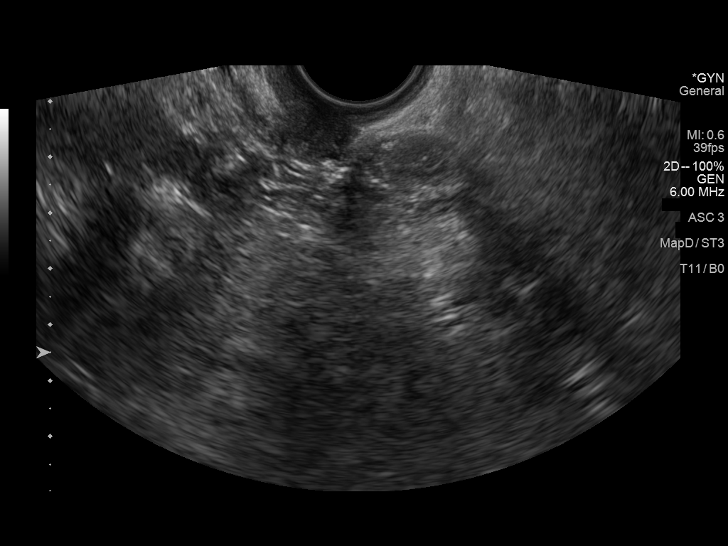

[14 of 25 positions shown; findings below may reference images not displayed]

FINDINGS: Uterus

Surgically absent.

Right ovary

Not visualized.

Left ovary

Not visualized.

Other findings

No mass or free peritoneal fluid seen.
IMPRESSION: 1. No pelvic mass visible by ultrasound.
2. Status post hysterectomy and unilateral oophorectomy without
visualization of the remaining ovary.

## 2020-03-30 DIAGNOSIS — R42 Dizziness and giddiness: Secondary | ICD-10-CM | POA: Diagnosis not present

## 2020-03-30 DIAGNOSIS — R110 Nausea: Secondary | ICD-10-CM | POA: Diagnosis not present

## 2020-05-04 DIAGNOSIS — H2513 Age-related nuclear cataract, bilateral: Secondary | ICD-10-CM | POA: Diagnosis not present

## 2020-05-04 DIAGNOSIS — H52211 Irregular astigmatism, right eye: Secondary | ICD-10-CM | POA: Diagnosis not present

## 2020-05-04 DIAGNOSIS — H5319 Other subjective visual disturbances: Secondary | ICD-10-CM | POA: Diagnosis not present

## 2020-05-04 DIAGNOSIS — H16223 Keratoconjunctivitis sicca, not specified as Sjogren's, bilateral: Secondary | ICD-10-CM | POA: Diagnosis not present

## 2020-05-04 DIAGNOSIS — H5201 Hypermetropia, right eye: Secondary | ICD-10-CM | POA: Diagnosis not present

## 2020-05-04 DIAGNOSIS — H524 Presbyopia: Secondary | ICD-10-CM | POA: Diagnosis not present

## 2020-07-19 DIAGNOSIS — M1811 Unilateral primary osteoarthritis of first carpometacarpal joint, right hand: Secondary | ICD-10-CM | POA: Diagnosis not present

## 2020-07-19 DIAGNOSIS — M19042 Primary osteoarthritis, left hand: Secondary | ICD-10-CM | POA: Diagnosis not present

## 2020-08-11 DIAGNOSIS — Z0000 Encounter for general adult medical examination without abnormal findings: Secondary | ICD-10-CM | POA: Diagnosis not present

## 2020-08-11 DIAGNOSIS — R5383 Other fatigue: Secondary | ICD-10-CM | POA: Diagnosis not present

## 2020-08-11 DIAGNOSIS — Z131 Encounter for screening for diabetes mellitus: Secondary | ICD-10-CM | POA: Diagnosis not present

## 2020-08-11 DIAGNOSIS — E2839 Other primary ovarian failure: Secondary | ICD-10-CM | POA: Diagnosis not present

## 2020-08-11 DIAGNOSIS — M85851 Other specified disorders of bone density and structure, right thigh: Secondary | ICD-10-CM | POA: Diagnosis not present

## 2020-08-11 DIAGNOSIS — M2559 Pain in other specified joint: Secondary | ICD-10-CM | POA: Diagnosis not present

## 2020-08-11 DIAGNOSIS — F3342 Major depressive disorder, recurrent, in full remission: Secondary | ICD-10-CM | POA: Diagnosis not present

## 2021-01-06 DIAGNOSIS — Z1231 Encounter for screening mammogram for malignant neoplasm of breast: Secondary | ICD-10-CM | POA: Diagnosis not present

## 2021-01-11 DIAGNOSIS — X32XXXD Exposure to sunlight, subsequent encounter: Secondary | ICD-10-CM | POA: Diagnosis not present

## 2021-01-11 DIAGNOSIS — Z1283 Encounter for screening for malignant neoplasm of skin: Secondary | ICD-10-CM | POA: Diagnosis not present

## 2021-01-11 DIAGNOSIS — L570 Actinic keratosis: Secondary | ICD-10-CM | POA: Diagnosis not present

## 2021-01-11 DIAGNOSIS — L821 Other seborrheic keratosis: Secondary | ICD-10-CM | POA: Diagnosis not present

## 2021-01-23 DIAGNOSIS — Z13220 Encounter for screening for lipoid disorders: Secondary | ICD-10-CM | POA: Diagnosis not present

## 2021-01-23 DIAGNOSIS — E2839 Other primary ovarian failure: Secondary | ICD-10-CM | POA: Diagnosis not present

## 2021-01-23 DIAGNOSIS — Z01419 Encounter for gynecological examination (general) (routine) without abnormal findings: Secondary | ICD-10-CM | POA: Diagnosis not present

## 2021-01-23 DIAGNOSIS — M85851 Other specified disorders of bone density and structure, right thigh: Secondary | ICD-10-CM | POA: Diagnosis not present

## 2021-01-23 DIAGNOSIS — F3342 Major depressive disorder, recurrent, in full remission: Secondary | ICD-10-CM | POA: Diagnosis not present

## 2021-01-23 DIAGNOSIS — Z0000 Encounter for general adult medical examination without abnormal findings: Secondary | ICD-10-CM | POA: Diagnosis not present

## 2021-02-02 DIAGNOSIS — Z780 Asymptomatic menopausal state: Secondary | ICD-10-CM | POA: Diagnosis not present

## 2021-04-12 DIAGNOSIS — J209 Acute bronchitis, unspecified: Secondary | ICD-10-CM | POA: Diagnosis not present

## 2021-05-10 DIAGNOSIS — H16223 Keratoconjunctivitis sicca, not specified as Sjogren's, bilateral: Secondary | ICD-10-CM | POA: Diagnosis not present

## 2021-05-10 DIAGNOSIS — H2513 Age-related nuclear cataract, bilateral: Secondary | ICD-10-CM | POA: Diagnosis not present

## 2021-05-10 DIAGNOSIS — H5319 Other subjective visual disturbances: Secondary | ICD-10-CM | POA: Diagnosis not present

## 2021-05-10 DIAGNOSIS — H52211 Irregular astigmatism, right eye: Secondary | ICD-10-CM | POA: Diagnosis not present

## 2021-08-26 DIAGNOSIS — N3000 Acute cystitis without hematuria: Secondary | ICD-10-CM | POA: Diagnosis not present

## 2022-01-19 DIAGNOSIS — Z1231 Encounter for screening mammogram for malignant neoplasm of breast: Secondary | ICD-10-CM | POA: Diagnosis not present

## 2022-02-28 DIAGNOSIS — T148XXA Other injury of unspecified body region, initial encounter: Secondary | ICD-10-CM | POA: Diagnosis not present

## 2022-02-28 DIAGNOSIS — Z1283 Encounter for screening for malignant neoplasm of skin: Secondary | ICD-10-CM | POA: Diagnosis not present

## 2022-02-28 DIAGNOSIS — D225 Melanocytic nevi of trunk: Secondary | ICD-10-CM | POA: Diagnosis not present

## 2022-05-16 DIAGNOSIS — H16223 Keratoconjunctivitis sicca, not specified as Sjogren's, bilateral: Secondary | ICD-10-CM | POA: Diagnosis not present

## 2022-05-16 DIAGNOSIS — H2513 Age-related nuclear cataract, bilateral: Secondary | ICD-10-CM | POA: Diagnosis not present

## 2022-05-16 DIAGNOSIS — H5319 Other subjective visual disturbances: Secondary | ICD-10-CM | POA: Diagnosis not present

## 2022-05-16 DIAGNOSIS — H52211 Irregular astigmatism, right eye: Secondary | ICD-10-CM | POA: Diagnosis not present

## 2022-11-22 DIAGNOSIS — J029 Acute pharyngitis, unspecified: Secondary | ICD-10-CM | POA: Diagnosis not present

## 2023-02-01 DIAGNOSIS — Z1231 Encounter for screening mammogram for malignant neoplasm of breast: Secondary | ICD-10-CM | POA: Diagnosis not present

## 2023-05-09 DIAGNOSIS — M19042 Primary osteoarthritis, left hand: Secondary | ICD-10-CM | POA: Diagnosis not present

## 2023-06-19 DIAGNOSIS — M25742 Osteophyte, left hand: Secondary | ICD-10-CM | POA: Diagnosis not present

## 2023-06-19 DIAGNOSIS — M19042 Primary osteoarthritis, left hand: Secondary | ICD-10-CM | POA: Diagnosis not present

## 2023-06-26 DIAGNOSIS — K117 Disturbances of salivary secretion: Secondary | ICD-10-CM | POA: Diagnosis not present

## 2023-06-26 DIAGNOSIS — Z23 Encounter for immunization: Secondary | ICD-10-CM | POA: Diagnosis not present

## 2023-06-26 DIAGNOSIS — Z6820 Body mass index (BMI) 20.0-20.9, adult: Secondary | ICD-10-CM | POA: Diagnosis not present

## 2023-06-26 DIAGNOSIS — M85851 Other specified disorders of bone density and structure, right thigh: Secondary | ICD-10-CM | POA: Diagnosis not present

## 2023-06-26 DIAGNOSIS — I10 Essential (primary) hypertension: Secondary | ICD-10-CM | POA: Diagnosis not present

## 2023-06-26 DIAGNOSIS — E2839 Other primary ovarian failure: Secondary | ICD-10-CM | POA: Diagnosis not present

## 2023-06-26 DIAGNOSIS — Z0000 Encounter for general adult medical examination without abnormal findings: Secondary | ICD-10-CM | POA: Diagnosis not present

## 2023-06-26 DIAGNOSIS — F321 Major depressive disorder, single episode, moderate: Secondary | ICD-10-CM | POA: Diagnosis not present

## 2023-06-26 DIAGNOSIS — E785 Hyperlipidemia, unspecified: Secondary | ICD-10-CM | POA: Diagnosis not present

## 2023-07-01 ENCOUNTER — Other Ambulatory Visit (HOSPITAL_BASED_OUTPATIENT_CLINIC_OR_DEPARTMENT_OTHER): Payer: Self-pay | Admitting: Family Medicine

## 2023-07-01 DIAGNOSIS — E7841 Elevated Lipoprotein(a): Secondary | ICD-10-CM

## 2023-07-02 DIAGNOSIS — M19042 Primary osteoarthritis, left hand: Secondary | ICD-10-CM | POA: Diagnosis not present

## 2023-07-02 DIAGNOSIS — M13842 Other specified arthritis, left hand: Secondary | ICD-10-CM | POA: Diagnosis not present

## 2023-07-02 DIAGNOSIS — M25642 Stiffness of left hand, not elsewhere classified: Secondary | ICD-10-CM | POA: Diagnosis not present

## 2023-07-05 ENCOUNTER — Ambulatory Visit (HOSPITAL_BASED_OUTPATIENT_CLINIC_OR_DEPARTMENT_OTHER)
Admission: RE | Admit: 2023-07-05 | Discharge: 2023-07-05 | Disposition: A | Discharge status: Home / Self Care | Payer: Self-pay | Source: Ambulatory Visit | Attending: Family Medicine | Admitting: Family Medicine

## 2023-07-05 DIAGNOSIS — E7841 Elevated Lipoprotein(a): Secondary | ICD-10-CM | POA: Insufficient documentation

## 2023-07-09 DIAGNOSIS — M13842 Other specified arthritis, left hand: Secondary | ICD-10-CM | POA: Diagnosis not present

## 2023-07-09 DIAGNOSIS — M25642 Stiffness of left hand, not elsewhere classified: Secondary | ICD-10-CM | POA: Diagnosis not present

## 2023-07-10 DIAGNOSIS — H2513 Age-related nuclear cataract, bilateral: Secondary | ICD-10-CM | POA: Diagnosis not present

## 2023-07-10 DIAGNOSIS — H16223 Keratoconjunctivitis sicca, not specified as Sjogren's, bilateral: Secondary | ICD-10-CM | POA: Diagnosis not present

## 2023-07-30 DIAGNOSIS — M19042 Primary osteoarthritis, left hand: Secondary | ICD-10-CM | POA: Diagnosis not present

## 2023-09-10 DIAGNOSIS — M19042 Primary osteoarthritis, left hand: Secondary | ICD-10-CM | POA: Diagnosis not present

## 2023-09-17 DIAGNOSIS — R399 Unspecified symptoms and signs involving the genitourinary system: Secondary | ICD-10-CM | POA: Diagnosis not present

## 2023-09-17 DIAGNOSIS — N390 Urinary tract infection, site not specified: Secondary | ICD-10-CM | POA: Diagnosis not present

## 2023-10-17 DIAGNOSIS — M19042 Primary osteoarthritis, left hand: Secondary | ICD-10-CM | POA: Diagnosis not present

## 2023-11-11 DIAGNOSIS — M67912 Unspecified disorder of synovium and tendon, left shoulder: Secondary | ICD-10-CM | POA: Diagnosis not present

## 2024-02-07 DIAGNOSIS — Z1231 Encounter for screening mammogram for malignant neoplasm of breast: Secondary | ICD-10-CM | POA: Diagnosis not present

## 2024-04-29 DIAGNOSIS — D225 Melanocytic nevi of trunk: Secondary | ICD-10-CM | POA: Diagnosis not present

## 2024-04-29 DIAGNOSIS — L820 Inflamed seborrheic keratosis: Secondary | ICD-10-CM | POA: Diagnosis not present

## 2024-04-29 DIAGNOSIS — Z1283 Encounter for screening for malignant neoplasm of skin: Secondary | ICD-10-CM | POA: Diagnosis not present

## 2024-06-19 ENCOUNTER — Other Ambulatory Visit: Payer: Self-pay

## 2024-06-19 ENCOUNTER — Emergency Department (HOSPITAL_BASED_OUTPATIENT_CLINIC_OR_DEPARTMENT_OTHER)
Admission: EM | Admit: 2024-06-19 | Discharge: 2024-06-19 | Disposition: A | Discharge status: Home / Self Care | Attending: Emergency Medicine | Admitting: Emergency Medicine

## 2024-06-19 DIAGNOSIS — K219 Gastro-esophageal reflux disease without esophagitis: Secondary | ICD-10-CM | POA: Insufficient documentation

## 2024-06-19 DIAGNOSIS — Z79899 Other long term (current) drug therapy: Secondary | ICD-10-CM | POA: Diagnosis not present

## 2024-06-19 DIAGNOSIS — R1013 Epigastric pain: Secondary | ICD-10-CM | POA: Diagnosis present

## 2024-06-19 DIAGNOSIS — K2900 Acute gastritis without bleeding: Secondary | ICD-10-CM | POA: Diagnosis not present

## 2024-06-19 LAB — COMPREHENSIVE METABOLIC PANEL WITH GFR
ALT: 10 U/L (ref 0–44)
AST: 20 U/L (ref 15–41)
Albumin: 4.4 g/dL (ref 3.5–5.0)
Alkaline Phosphatase: 93 U/L (ref 38–126)
Anion gap: 11 (ref 5–15)
BUN: 15 mg/dL (ref 8–23)
CO2: 27 mmol/L (ref 22–32)
Calcium: 10.1 mg/dL (ref 8.9–10.3)
Chloride: 99 mmol/L (ref 98–111)
Creatinine, Ser: 0.8 mg/dL (ref 0.44–1.00)
GFR, Estimated: >60 mL/min
Glucose, Bld: 109 mg/dL — ABNORMAL HIGH (ref 70–99)
Potassium: 4.7 mmol/L (ref 3.5–5.1)
Sodium: 137 mmol/L (ref 135–145)
Total Bilirubin: 0.4 mg/dL (ref 0.0–1.2)
Total Protein: 7.3 g/dL (ref 6.5–8.1)

## 2024-06-19 LAB — URINALYSIS, W/ REFLEX TO CULTURE (INFECTION SUSPECTED)
Bacteria, UA: NONE SEEN
Bilirubin Urine: NEGATIVE
Glucose, UA: NEGATIVE mg/dL
Hgb urine dipstick: NEGATIVE
Ketones, ur: 40 mg/dL — AB
Leukocytes,Ua: NEGATIVE
Nitrite: NEGATIVE
Protein, ur: NEGATIVE mg/dL
Specific Gravity, Urine: 1.02 (ref 1.005–1.030)
pH: 6 (ref 5.0–8.0)

## 2024-06-19 LAB — CBC WITH DIFFERENTIAL/PLATELET
Abs Immature Granulocytes: 0.01 K/uL (ref 0.00–0.07)
Basophils Absolute: 0 K/uL (ref 0.0–0.1)
Basophils Relative: 0 %
Eosinophils Absolute: 0.2 K/uL (ref 0.0–0.5)
Eosinophils Relative: 2 %
HCT: 44.8 % (ref 36.0–46.0)
Hemoglobin: 15.3 g/dL — ABNORMAL HIGH (ref 12.0–15.0)
Immature Granulocytes: 0 %
Lymphocytes Relative: 21 %
Lymphs Abs: 1.4 K/uL (ref 0.7–4.0)
MCH: 29.7 pg (ref 26.0–34.0)
MCHC: 34.2 g/dL (ref 30.0–36.0)
MCV: 87 fL (ref 80.0–100.0)
Monocytes Absolute: 0.5 K/uL (ref 0.1–1.0)
Monocytes Relative: 7 %
Neutro Abs: 4.9 K/uL (ref 1.7–7.7)
Neutrophils Relative %: 70 %
Platelets: 241 K/uL (ref 150–400)
RBC: 5.15 MIL/uL — ABNORMAL HIGH (ref 3.87–5.11)
RDW: 12.8 % (ref 11.5–15.5)
WBC: 7 K/uL (ref 4.0–10.5)
nRBC: 0 % (ref 0.0–0.2)

## 2024-06-19 LAB — LIPASE, BLOOD: Lipase: 19 U/L (ref 11–51)

## 2024-06-19 MED ORDER — FAMOTIDINE 20 MG PO TABS
20.0000 mg | ORAL_TABLET | Freq: Once | ORAL | Status: AC
  Administered 2024-06-19: 20 mg via ORAL
  Filled 2024-06-19: qty 1

## 2024-06-19 MED ORDER — LIDOCAINE VISCOUS HCL 2 % MT SOLN
15.0000 mL | Freq: Once | OROMUCOSAL | Status: AC
  Administered 2024-06-19: 15 mL via ORAL
  Filled 2024-06-19: qty 15

## 2024-06-19 MED ORDER — SUCRALFATE 1 G PO TABS: 1.0000 g | ORAL_TABLET | Freq: Three times a day (TID) | ORAL | 0 refills | Status: AC

## 2024-06-19 MED ORDER — PANTOPRAZOLE SODIUM 40 MG IV SOLR
40.0000 mg | Freq: Once | INTRAVENOUS | Status: AC
  Administered 2024-06-19: 40 mg via INTRAVENOUS
  Filled 2024-06-19: qty 10

## 2024-06-19 MED ORDER — ALUM & MAG HYDROXIDE-SIMETH 200-200-20 MG/5ML PO SUSP
30.0000 mL | Freq: Once | ORAL | Status: AC
  Administered 2024-06-19: 30 mL via ORAL
  Filled 2024-06-19: qty 30

## 2024-06-19 MED ORDER — PANTOPRAZOLE SODIUM 20 MG PO TBEC: 20.0000 mg | DELAYED_RELEASE_TABLET | Freq: Every day | ORAL | 1 refills | Status: AC

## 2024-06-19 NOTE — ED Notes (Signed)
 Reviewed discharge instructions, medications, and home care with pt. Pt verbalized understanding and had no further questions. Pt exited ED without complications.

## 2024-06-19 NOTE — ED Provider Notes (Signed)
 " [place] [facility] AT [facility] Provider Note   CSN: [id number] Arrival date & time: 06/19/24  1315     Patient presents with: Abdominal Pain   Taylor Le is a 74 y.o. female who presents for a 1 week history of epigastric discomfort.  Also has decreased appetite, intermittent nausea, had originally presented with some nausea and vomiting approximately a week ago that resolved however she presents to the ED today worsening pain in the epigastrium as well as increased nausea.  Thus far has not been diagnosed with any esophageal reflux or peptic ulcer disease.  Seen at walk-in clinic, referred to ED for labs and possible further imaging related to upper abdominal pain.  It is noted that this patient had cholecystectomy in 2018, further history of total hysterectomy, date unspecified.    Abdominal Pain Associated symptoms: nausea        Prior to Admission medications  Medication Sig Start Date End Date Taking? Authorizing Provider  buPROPion (WELLBUTRIN XL) 300 MG 24 hr tablet Take 300 mg by mouth daily. 06/18/24  Yes [provider]  estradiol (VIVELLE-DOT) 0.025 MG/24HR Place 1 patch onto the skin 2 (two) times a week. 06/12/24  Yes [provider]  pantoprazole  (PROTONIX ) 20 MG tablet Take 1 tablet (20 mg total) by mouth daily. 06/19/24  Yes Myriam Dorn BROCKS, PA  sucralfate  (CARAFATE ) 1 g tablet Take 1 tablet (1 g total) by mouth 4 (four) times daily -  with meals and at bedtime. 06/19/24  Yes Myriam Dorn BROCKS, PA  buPROPion (WELLBUTRIN XL) 150 MG 24 hr tablet Take 450 mg by mouth daily. 12/13/16   [provider]  Calcium Carbonate-Vit D-Min (CALCIUM 1200 PO) Take 1,200 mg by mouth daily.    [provider]  estradiol (VIVELLE-DOT) 0.0375 MG/24HR Place 1 patch onto the skin 2 (two) times a week. 12/01/16   [provider]  HYDROcodone -acetaminophen  (NORCO/VICODIN) 5-325 MG tablet Take 1 tablet by mouth every 6  (six) hours as needed for moderate pain. 12/26/16   Signe Mitzie LABOR, MD  Hypromellose (NATURAL BALANCE TEARS OP) Place 1 drop into both eyes daily as needed (dry eyes).    [provider]  RETIN-A 0.1 % cream Apply 1 application topically See admin instructions. Uses 3 times monthly at bedtime 09/27/16   [provider]    Allergies: Penicillin g    Review of Systems  Gastrointestinal:  Positive for abdominal pain and nausea.  All other systems reviewed and are negative.   Updated Vital Signs BP 118/66   Pulse 77   Temp 98.1 F (36.7 C)   Resp 16   SpO2 100%   Physical Exam Vitals and nursing note reviewed.  Constitutional:      General: She is not in acute distress.    Appearance: Normal appearance. She is well-developed and normal weight.  HENT:     Head: Normocephalic and atraumatic.     Mouth/Throat:     Mouth: Mucous membranes are moist.     Pharynx: Oropharynx is clear.  Eyes:     General: No scleral icterus.    Extraocular Movements: Extraocular movements intact.     Conjunctiva/sclera: Conjunctivae normal.     Pupils: Pupils are equal, round, and reactive to light.  Cardiovascular:     Rate and Rhythm: Normal rate and regular rhythm.     Pulses: Normal pulses.     Heart sounds: Normal heart sounds. No murmur heard.    No  friction rub. No gallop.  Pulmonary:     Effort: Pulmonary effort is normal.     Breath sounds: Normal breath sounds.  Abdominal:     General: Abdomen is flat. Bowel sounds are normal.     Palpations: Abdomen is soft.     Tenderness: There is no abdominal tenderness.     Hernia: No hernia is present.  Musculoskeletal:        General: Normal range of motion.     Cervical back: Normal range of motion and neck supple.     Right lower leg: No edema.     Left lower leg: No edema.  Skin:    General: Skin is warm and dry.     Capillary Refill: Capillary refill takes less than 2 seconds.  Neurological:     General: No focal  deficit present.     Mental Status: She is alert and oriented to person, place, and time. Mental status is at baseline.     GCS: GCS eye subscore is 4. GCS verbal subscore is 5. GCS motor subscore is 6.  Psychiatric:        Mood and Affect: Mood normal.     (all labs ordered are listed, but only abnormal results are displayed) Labs Reviewed  CBC WITH DIFFERENTIAL/PLATELET - Abnormal; Notable for the following components:      Result Value   RBC 5.15 (*)    Hemoglobin 15.3 (*)    All other components within normal limits  COMPREHENSIVE METABOLIC PANEL WITH GFR - Abnormal; Notable for the following components:   Glucose, Bld 109 (*)    All other components within normal limits  URINALYSIS, W/ REFLEX TO CULTURE (INFECTION SUSPECTED) - Abnormal; Notable for the following components:   Ketones, ur 40 (*)    All other components within normal limits  LIPASE, BLOOD    EKG: None  Radiology: No results found.   Procedures   Medications Ordered in the ED  pantoprazole  (PROTONIX ) injection 40 mg (40 mg Intravenous Given 06/19/24 1506)  alum & mag hydroxide-simeth (MAALOX/MYLANTA) 200-200-20 MG/5ML suspension 30 mL (30 mLs Oral Given 06/19/24 1455)    And  lidocaine  (XYLOCAINE ) 2 % viscous mouth solution 15 mL (15 mLs Oral Given 06/19/24 1455)  famotidine  (PEPCID ) tablet 20 mg (20 mg Oral Given 06/19/24 1456)                                    Medical Decision Making Amount and/or Complexity of Data Reviewed Labs: ordered.  Risk OTC drugs. Prescription drug management.   Medical Decision Making:   Taylor Le is a 74 y.o. female who presented to the ED today with epigastric pain detailed above.     Complete initial physical exam performed, notably the patient  was alert and oriented in no apparent distress.  Abdomen soft nontender, nondistended.    Reviewed and confirmed nursing documentation for past medical history, family history, social history.    Initial  Assessment:   With the patient's presentation of epigastric pain, most likely diagnosis is reflux/gastritis. Other diagnoses were considered including (but not limited to) ACS/STEMI, hepatobiliary disease, pancreatitis. These are considered less likely due to history of present illness and physical exam findings.     Initial Plan:  Provided with GI cocktail as well as famotidine  and pantoprazole  for management of gastric symptoms Screening labs including CBC and Metabolic panel to evaluate for infectious  or metabolic etiology of disease.  Urinalysis with reflex culture ordered to evaluate for UTI or relevant urologic/nephrologic pathology.  EKG to evaluate for cardiac pathology Objective evaluation as below reviewed   Initial Study Results:   Laboratory  All laboratory results reviewed without evidence of clinically relevant pathology.   Exceptions include: None  EKG EKG was reviewed independently. Rate, rhythm, axis, intervals all examined and without medically relevant abnormality. ST segments without concerns for elevations.    Reassessment and Plan:   Based on this patient's presenting signs and symptoms, as well as the physical exam, does not appear to be cardiac in etiology as EKG is normal and she does not have any radiation of pain.  Decree suspicion of hepatobiliary disease as patient has cholecystectomy prior, and transaminases and alkaline phosphatase are within normal limits, do not believe this is pancreatitis as the lipase is within normal limits as well and she has no tenderness to the epigastrium.  On reassessment of the patient she had improvement of her symptoms with GI cocktail.  As such I this to be consistent with likely reflux and gastritis given the recent episode of GI illness and continuing symptoms after.  Will begin her on a course of pantoprazole  as well as cervical fate, and refer to PCP for further evaluation and likely GI referral.  She understands agrees has no  further concerns at this time and thus will be discharged with outpatient management at this time.       Final diagnoses:  Gastroesophageal reflux disease, unspecified whether esophagitis present  Acute gastritis without hemorrhage, unspecified gastritis type    ED Discharge Orders          Ordered    sucralfate  (CARAFATE ) 1 g tablet  3 times daily with meals & bedtime        06/19/24 1544    pantoprazole  (PROTONIX ) 20 MG tablet  Daily        06/19/24 1544               Myriam Dorn BROCKS, [place] 06/19/24 1549  "

## 2024-06-19 NOTE — ED Provider Triage Note (Signed)
 Emergency Medicine Provider Triage Evaluation Note  Taylor Le , a 74 y.o. female  was evaluated in triage.  Pt complains of upper nominal pain.  Reports that approximately a week ago she had repeated nausea and vomiting that lasted 2 days, resolved, however she had poor appetite.  Reports that today the pain returned in her upper abdomen and she had another episode of emesis.  Reports that she had a small bowel movement this morning which was at baseline.  Denies other acute complaints.  No known history of GERD/reflux.  Review of Systems  Positive: Per above Negative: Per above  Physical Exam  There were no vitals taken for this visit. Gen:   Awake, no distress   Resp:  Normal effort  MSK:   Moves extremities without difficulty  Other:  Tenderness to palpation in epigastrium  Medical Decision Making  Medically screening exam initiated at 1:24 PM.  Appropriate orders placed.  Taylor Le was informed that the remainder of the evaluation will be completed by another provider, this initial triage assessment does not replace that evaluation, and the importance of remaining in the ED until their evaluation is complete.   Rogelia Jerilynn GORMAN, MD 06/19/24 [phone]

## 2024-06-19 NOTE — ED Triage Notes (Signed)
 Pt reports upper abdominal pain and nausea for 1 week.  Seen in UC for same today, Xray performed and pt sent to ED for further eval.  Pt vomited once this morning, and twice 1 week ago. Decreased appetite.  AAOx4 in triage, NAD.
